# Patient Record
Sex: Female | Born: 1960 | Race: White | Hispanic: No | Marital: Married | Smoking: Never smoker
Health system: Southern US, Community
[De-identification: ages and names within clinical notes are randomized; demographics above are authoritative.]

## PROBLEM LIST (undated history)

## (undated) DIAGNOSIS — G43909 Migraine, unspecified, not intractable, without status migrainosus: Secondary | ICD-10-CM

## (undated) DIAGNOSIS — Z636 Dependent relative needing care at home: Secondary | ICD-10-CM

## (undated) DIAGNOSIS — E559 Vitamin D deficiency, unspecified: Secondary | ICD-10-CM

## (undated) DIAGNOSIS — E785 Hyperlipidemia, unspecified: Secondary | ICD-10-CM

## (undated) DIAGNOSIS — Z833 Family history of diabetes mellitus: Secondary | ICD-10-CM

## (undated) DIAGNOSIS — E669 Obesity, unspecified: Secondary | ICD-10-CM

## (undated) DIAGNOSIS — I10 Essential (primary) hypertension: Secondary | ICD-10-CM

## (undated) DIAGNOSIS — K219 Gastro-esophageal reflux disease without esophagitis: Secondary | ICD-10-CM

## (undated) HISTORY — DX: Gastro-esophageal reflux disease without esophagitis: K21.9

## (undated) HISTORY — DX: Vitamin D deficiency, unspecified: E55.9

## (undated) HISTORY — DX: Migraine, unspecified, not intractable, without status migrainosus: G43.909

## (undated) HISTORY — DX: Dependent relative needing care at home: Z63.6

## (undated) HISTORY — DX: Family history of diabetes mellitus: Z83.3

## (undated) HISTORY — DX: Obesity, unspecified: E66.9

## (undated) HISTORY — DX: Essential (primary) hypertension: I10

## (undated) HISTORY — DX: Hyperlipidemia, unspecified: E78.5

---

## 2018-02-03 ENCOUNTER — Ambulatory Visit: Payer: Self-pay | Admitting: Cardiology

## 2019-05-06 DIAGNOSIS — Z833 Family history of diabetes mellitus: Secondary | ICD-10-CM | POA: Insufficient documentation

## 2019-05-06 DIAGNOSIS — K219 Gastro-esophageal reflux disease without esophagitis: Secondary | ICD-10-CM | POA: Insufficient documentation

## 2019-05-06 DIAGNOSIS — G43909 Migraine, unspecified, not intractable, without status migrainosus: Secondary | ICD-10-CM | POA: Insufficient documentation

## 2019-05-06 DIAGNOSIS — I10 Essential (primary) hypertension: Secondary | ICD-10-CM | POA: Insufficient documentation

## 2019-05-06 DIAGNOSIS — E6609 Other obesity due to excess calories: Secondary | ICD-10-CM | POA: Insufficient documentation

## 2019-05-06 DIAGNOSIS — E559 Vitamin D deficiency, unspecified: Secondary | ICD-10-CM | POA: Insufficient documentation

## 2019-05-06 DIAGNOSIS — Z636 Dependent relative needing care at home: Secondary | ICD-10-CM | POA: Insufficient documentation

## 2022-04-27 DIAGNOSIS — E669 Obesity, unspecified: Secondary | ICD-10-CM | POA: Insufficient documentation

## 2022-04-27 DIAGNOSIS — E785 Hyperlipidemia, unspecified: Secondary | ICD-10-CM | POA: Insufficient documentation

## 2022-04-30 NOTE — Progress Notes (Unsigned)
Cardiology Office Note:    Date:  05/09/2022   ID:  Tammy Barrera, DOB 06-10-1961, MRN [id number]  PCP:  [facility]   [facility] Providers Cardiologist:  None {  Referring MD: Meriam Sprague, MD    History of Present Illness:    Tammy Barrera is a 61 y.o. female with a hx of HTN and HLD who was referred for further management of CV risk factors.   Past Medical History:  Diagnosis Date   Caregiver stress    Family history of diabetes mellitus (DM)    GERD (gastroesophageal reflux disease)    Hyperlipidemia    Hypertension    Migraines    Obesity    Vitamin D deficiency     History reviewed. No pertinent surgical history.  Current Medications: Current Meds  Medication Sig   MAGNESIUM-ZINC PO Take 1 tablet by mouth daily.   Multiple Vitamin (MULTIVITAMIN PO) Take 1 tablet by mouth daily.   Thiamine HCl (THIAMINE PO) Take 1 tablet by mouth daily.   VITAMIN D PO Take 1 tablet by mouth daily.     Allergies:   Patient has no known allergies.   Social History   Socioeconomic History   Marital status: Married    Spouse name: Not on file   Number of children: Not on file   Years of education: Not on file   Highest education level: Not on file  Occupational History   Not on file  Tobacco Use   Smoking status: Never   Smokeless tobacco: Never  Substance and Sexual Activity   Alcohol use: Not on file   Drug use: Not on file   Sexual activity: Not on file  Other Topics Concern   Not on file  Social History Narrative   Not on file   Social Determinants of Health   Financial Resource Strain: Not on file  Food Insecurity: Not on file  Transportation Needs: Not on file  Physical Activity: Not on file  Stress: Not on file  Social Connections: Not on file     Family History: The patient's ***family history includes Other in her father.  ROS:   Please see the history of present illness.    *** All other  systems reviewed and are negative.  EKGs/Labs/Other Studies Reviewed:    The following studies were reviewed today: ***  EKG:  EKG is *** ordered today.  The ekg ordered today demonstrates ***  Recent Labs: No results found for requested labs within last 8760 hours.  Recent Lipid Panel No results found for: CHOL, TRIG, HDL, CHOLHDL, VLDL, LDLCALC, LDLDIRECT   Risk Assessment/Calculations:   {Does this patient have ATRIAL FIBRILLATION?:[phone]}       Physical Exam:    VS:  BP 140/90   Pulse (!) 107   Ht 5\' 4"  (1.626 m)   Wt 188 lb 9.6 oz (85.5 kg)   SpO2 96%   BMI 32.37 kg/m     Wt Readings from Last 3 Encounters:  05/09/22 188 lb 9.6 oz (85.5 kg)     GEN: *** Well nourished, well developed in no acute distress HEENT: Normal NECK: No JVD; No carotid bruits LYMPHATICS: No lymphadenopathy CARDIAC: ***RRR, no murmurs, rubs, gallops RESPIRATORY:  Clear to auscultation without rales, wheezing or rhonchi  ABDOMEN: Soft, non-tender, non-distended MUSCULOSKELETAL:  No edema; No deformity  SKIN: Warm and dry NEUROLOGIC:  Alert and oriented x 3 PSYCHIATRIC:  Normal affect   ASSESSMENT:    No  diagnosis found. PLAN:    In order of problems listed above:  #HTN: Previously on telmesartan-HCTZ but not currently on meds  #HLD: LDL 146, HDL 63, TG 178. -Check Ca score      {Are you ordering a CV Procedure (e.g. stress test, cath, DCCV, TEE, etc)?   Press F2        :[id number]}    Medication Adjustments/Labs and Tests Ordered: Current medicines are reviewed at length with the patient today.  Concerns regarding medicines are outlined above.  No orders of the defined types were placed in this encounter.  No orders of the defined types were placed in this encounter.   There are no Patient Instructions on file for this visit.   Signed, Meriam Sprague, MD  05/09/2022 4:34 PM    [facility]

## 2022-05-09 ENCOUNTER — Ambulatory Visit: Payer: 59 | Admitting: Cardiology

## 2022-05-09 ENCOUNTER — Encounter: Payer: Self-pay | Admitting: Cardiology

## 2022-05-09 VITALS — BP 140/90 | HR 107 | Ht 64.0 in | Wt 188.6 lb

## 2022-05-09 DIAGNOSIS — I10 Essential (primary) hypertension: Secondary | ICD-10-CM

## 2022-05-09 DIAGNOSIS — Z8249 Family history of ischemic heart disease and other diseases of the circulatory system: Secondary | ICD-10-CM

## 2022-05-09 DIAGNOSIS — Z79899 Other long term (current) drug therapy: Secondary | ICD-10-CM | POA: Diagnosis not present

## 2022-05-09 DIAGNOSIS — E785 Hyperlipidemia, unspecified: Secondary | ICD-10-CM | POA: Diagnosis not present

## 2022-05-09 MED ORDER — VALSARTAN 80 MG PO TABS: 80.0000 mg | ORAL_TABLET | Freq: Every day | ORAL | 2 refills | Status: DC

## 2022-05-09 NOTE — Patient Instructions (Signed)
Medication Instructions:   START TAKING VALSARTAN 80 MG BY MOUTH DAILY  *If you need a refill on your cardiac medications before your next appointment, please call your pharmacy*   Lab Work:  IN 2 WEEKS HERE IN THE OFFICE--BMET  If you have labs (blood work) drawn today and your tests are completely normal, you will receive your results only by: MyChart Message (if you have MyChart) OR A paper copy in the mail If you have any lab test that is abnormal or we need to change your treatment, we will call you to review the results.   Testing/Procedures:  CARDIAC CALCIUM SCORE (SELF PAY) DONE HERE IN THE OFFICE   Follow-Up: At [facility], you and your health needs are our priority.  As part of our continuing mission to provide you with exceptional heart care, we have created designated Provider Care Teams.  These Care Teams include your primary Cardiologist (physician) and Advanced Practice Providers (APPs -  Physician Assistants and Nurse Practitioners) who all work together to provide you with the care you need, when you need it.  We recommend signing up for the patient portal called "MyChart".  Sign up information is provided on this After Visit Summary.  MyChart is used to connect with patients for Virtual Visits (Telemedicine).  Patients are able to view lab/test results, encounter notes, upcoming appointments, etc.  Non-urgent messages can be sent to your provider as well.   To learn more about what you can do with MyChart, go to [email or web address].    Your next appointment:   6 month(s)  The format for your next appointment:   In Person  Provider:   DR. Shari Prows   Important Information About Sugar

## 2022-05-09 NOTE — Progress Notes (Unsigned)
Cardiology Office Note:    Date:  05/09/2022   ID:  Tammy Barrera, DOB 04-14-1961, MRN [id number]  PCP:  [facility]   [facility] Providers Cardiologist:  None {  Referring MD: Freada Bergeron, MD    History of Present Illness:    Tammy Barrera is a 61 y.o. female with a hx of HTN and HLD who was referred for further management of CV risk factors.   She was most recently seen by Dr. Doreene Nest on 04/27/2022. 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 At that visit she denied current cardiovascular symptoms.  Today, the patient states that she is here to establish care for risk stratification. Her regimen has previously included Telmisartan-HCTZ, Zetia, and Topamax (for migraines). She denies being on any statin medication for cholesterol.  Sometimes she feels racing heart beats, but she is not overly concerned about this.  Occasionally she notices some LE edema. She denies any orthopnea or PND.  For activity she completes lots of yard work and walking. She does not do much intense, formal exercise. Also denies any physical limitations from a cardiovascular perspective. No known episodes of dehydration.  She states that controlling her weight has always been a struggle. Lately she notices her weight increases despite trying to follow a more healthy diet.  She denies any chest pain, or shortness of breath. No lightheadedness, headaches, syncope, orthopnea, or PND.  On 04/27/22 she had lab work completed at [facility].   Past Medical History:  Diagnosis Date   Caregiver stress    Family history of diabetes mellitus (DM)    GERD (gastroesophageal reflux disease)    Hyperlipidemia    Hypertension    Migraines    Obesity    Vitamin D deficiency     History reviewed. No pertinent surgical history.  Current Medications: Current Meds  Medication Sig   MAGNESIUM-ZINC PO Take 1 tablet by mouth  daily.   Multiple Vitamin (MULTIVITAMIN PO) Take 1 tablet by mouth daily.   Thiamine HCl (THIAMINE PO) Take 1 tablet by mouth daily.   valsartan (DIOVAN) 80 MG tablet Take 1 tablet (80 mg total) by mouth daily.   VITAMIN D PO Take 1 tablet by mouth daily.     Allergies:   Patient has no known allergies.   Social History   Socioeconomic History   Marital status: Married    Spouse name: Not on file   Number of children: Not on file   Years of education: Not on file   Highest education level: Not on file  Occupational History   Not on file  Tobacco Use   Smoking status: Never   Smokeless tobacco: Never  Substance and Sexual Activity   Alcohol use: Not on file   Drug use: Not on file   Sexual activity: Not on file  Other Topics Concern   Not on file  Social History Narrative   Not on file   Social Determinants of Health   Financial Resource Strain: Not on file  Food Insecurity: Not on file  Transportation Needs: Not on file  Physical Activity: Not on file  Stress: Not on file  Social Connections: Not on file     Family History: The patient's family history includes Other in her father.  ROS:   Review of Systems  Constitutional:  Negative for chills and fever.  HENT:  Negative for ear pain.   Eyes:  Negative  for photophobia.  Respiratory:  Negative for hemoptysis, sputum production and stridor.   Cardiovascular:  Positive for palpitations and leg swelling. Negative for chest pain, orthopnea, claudication and PND.  Gastrointestinal:  Negative for melena and vomiting.  Genitourinary:  Negative for flank pain.  Musculoskeletal:  Negative for falls.  Neurological:  Negative for sensory change and seizures.  Endo/Heme/Allergies:  Negative for polydipsia.  Psychiatric/Behavioral:  Negative for memory loss and substance abuse.     EKGs/Labs/Other Studies Reviewed:    The following studies were reviewed today:  No prior cardiovascular studies available.   EKG:   EKG is personally reviewed. 05/09/2022: Sinus tachycardia. Rate 107 bpm.   Recent Labs: No results found for requested labs within last 8760 hours.   Recent Lipid Panel No results found for: CHOL, TRIG, HDL, CHOLHDL, VLDL, LDLCALC, LDLDIRECT   Risk Assessment/Calculations:           Physical Exam:    VS:  BP 140/90   Pulse (!) 107   Ht 5\' 4"  (1.626 m)   Wt 188 lb 9.6 oz (85.5 kg)   SpO2 96%   BMI 32.37 kg/m     Wt Readings from Last 3 Encounters:  05/09/22 188 lb 9.6 oz (85.5 kg)     GEN: Well nourished, well developed in no acute distress HEENT: Normal NECK: No JVD; No carotid bruits CARDIAC: RRR, 1/6 soft systolic murmurs, No rubs, no gallops RESPIRATORY:  Clear to auscultation without rales, wheezing or rhonchi  ABDOMEN: Soft, non-tender, non-distended MUSCULOSKELETAL:  No edema; No deformity  SKIN: Warm and dry NEUROLOGIC:  Alert and oriented x 3 PSYCHIATRIC:  Normal affect   ASSESSMENT:    1. Family history of early CAD   2. Medication management   3. Hyperlipidemia, unspecified hyperlipidemia type   4. Hypertension, unspecified type    PLAN:    In order of problems listed above:  #HTN: Previously on telmesartan-HCTZ but not currently on meds. BP 140 today. Will restart ARB and monitor response. -Continue valsartan 80mg  daily -Check BMET next week -Keep BP log and can uptitrate as needed if BP not at goal <120/80s  #HLD: #Family history of CAD LDL 146, HDL 63, TG 178. -Check Ca score for risk stratification -Patient amenable to start statin if Ca score elevated         Follow-up:  6 months.  Medication Adjustments/Labs and Tests Ordered: Current medicines are reviewed at length with the patient today.  Concerns regarding medicines are outlined above.   Orders Placed This Encounter  Procedures   CT CARDIAC SCORING (SELF PAY ONLY)   Basic metabolic panel   EKG [id number]   Meds ordered this encounter  Medications   valsartan (DIOVAN) 80  MG tablet    Sig: Take 1 tablet (80 mg total) by mouth daily.    Dispense:  90 tablet    Refill:  2   Patient Instructions  Medication Instructions:   START TAKING VALSARTAN 80 MG BY MOUTH DAILY  *If you need a refill on your cardiac medications before your next appointment, please call your pharmacy*   Lab Work:  IN 2 [place] OFFICE--BMET  If you have labs (blood work) drawn today and your tests are completely normal, you will receive your results only by: [place] (if you have MyChart) OR A paper copy in the mail If you have any lab test that is abnormal or we need to change your treatment, we will call you to review the  results.   Testing/Procedures:  CARDIAC CALCIUM SCORE (SELF PAY) DONE HERE IN THE OFFICE   Follow-Up: At [facility], you and your health needs are our priority.  As part of our continuing mission to provide you with exceptional heart care, we have created designated Provider Care Teams.  These Care Teams include your primary Cardiologist (physician) and Advanced Practice Providers (APPs -  Physician Assistants and Nurse Practitioners) who all work together to provide you with the care you need, when you need it.  We recommend signing up for the patient portal called "MyChart".  Sign up information is provided on this After Visit Summary.  MyChart is used to connect with patients for Virtual Visits (Telemedicine).  Patients are able to view lab/test results, encounter notes, upcoming appointments, etc.  Non-urgent messages can be sent to your provider as well.   To learn more about what you can do with MyChart, go to [email or web address].    Your next appointment:   6 month(s)  The format for your next appointment:   In Person  Provider:   DR. Johney Frame   Important Information About Sugar         I,Mathew Stumpf,acting as a scribe for Freada Bergeron, MD.,have documented all relevant documentation on the behalf of  Freada Bergeron, MD,as directed by  Freada Bergeron, MD while in the presence of Freada Bergeron, MD.  I, Freada Bergeron, MD, have reviewed all documentation for this visit. The documentation on 05/09/22 for the exam, diagnosis, procedures, and orders are all accurate and complete.   Signed, Freada Bergeron, MD  05/09/2022 5:19 PM    [place]

## 2022-05-23 ENCOUNTER — Other Ambulatory Visit: Payer: 59

## 2022-05-23 ENCOUNTER — Ambulatory Visit
Admission: RE | Admit: 2022-05-23 | Discharge: 2022-05-23 | Disposition: A | Discharge status: Home / Self Care | Payer: 59 | Source: Ambulatory Visit | Attending: Cardiology | Admitting: Cardiology

## 2022-05-23 DIAGNOSIS — E785 Hyperlipidemia, unspecified: Secondary | ICD-10-CM

## 2022-05-23 DIAGNOSIS — Z8249 Family history of ischemic heart disease and other diseases of the circulatory system: Secondary | ICD-10-CM

## 2022-05-23 DIAGNOSIS — I10 Essential (primary) hypertension: Secondary | ICD-10-CM

## 2022-05-23 DIAGNOSIS — Z79899 Other long term (current) drug therapy: Secondary | ICD-10-CM

## 2022-05-24 LAB — BASIC METABOLIC PANEL
BUN/Creatinine Ratio: 12 (ref 12–28)
BUN: 10 mg/dL (ref 8–27)
CO2: 24 mmol/L (ref 20–29)
Calcium: 9.1 mg/dL (ref 8.7–10.3)
Chloride: 103 mmol/L (ref 96–106)
Creatinine, Ser: 0.81 mg/dL (ref 0.57–1.00)
Glucose: 95 mg/dL (ref 70–99)
Potassium: 3.8 mmol/L (ref 3.5–5.2)
Sodium: 142 mmol/L (ref 134–144)
eGFR: 83 mL/min/{1.73_m2} (ref >59)

## 2022-05-25 ENCOUNTER — Telehealth: Payer: Self-pay | Admitting: *Deleted

## 2022-05-25 DIAGNOSIS — E785 Hyperlipidemia, unspecified: Secondary | ICD-10-CM

## 2022-05-25 DIAGNOSIS — Z8249 Family history of ischemic heart disease and other diseases of the circulatory system: Secondary | ICD-10-CM

## 2022-05-25 DIAGNOSIS — I2510 Atherosclerotic heart disease of native coronary artery without angina pectoris: Secondary | ICD-10-CM

## 2022-05-25 DIAGNOSIS — Z79899 Other long term (current) drug therapy: Secondary | ICD-10-CM

## 2022-05-25 MED ORDER — ROSUVASTATIN CALCIUM 10 MG PO TABS: 10.0000 mg | ORAL_TABLET | Freq: Every day | ORAL | 2 refills | Status: DC

## 2022-05-25 NOTE — Telephone Encounter (Signed)
The patient has been notified of the result and verbalized understanding.  All questions (if any) were answered.  Pt agreed to start taking crestor 10 mg po daily and have repeat lipids in 6-8 weeks.  Confirmed the pharmacy of choice with the pt. Scheduled the pt for repeat lipids on 07/20/22.  She is aware to come fasting to this lab appt.  Pt verbalized understanding and agrees with this plan.

## 2022-05-25 NOTE — Telephone Encounter (Signed)
-----   Message from Sampson Goon, RN sent at 05/24/2022 10:34 AM EDT -----  ----- Message ----- From: Meriam Sprague, MD Sent: 05/23/2022   7:08 PM EDT To: [facility] Triage  Ca score is 24. This is the 75% for age, race, gender matched controls. Given that her Ca score is elevated which indicates she has some plaque in her heart arteries, we would like to start a statin to prevent the plaque from worsening. If she is amenable, would start crestor 10mg  daily and repeat lipids in 6-8 weeks. Our goal LDL for her is <70.

## 2022-07-20 ENCOUNTER — Other Ambulatory Visit: Payer: 59

## 2022-07-20 DIAGNOSIS — Z79899 Other long term (current) drug therapy: Secondary | ICD-10-CM

## 2022-07-20 DIAGNOSIS — I2510 Atherosclerotic heart disease of native coronary artery without angina pectoris: Secondary | ICD-10-CM

## 2022-07-20 DIAGNOSIS — E785 Hyperlipidemia, unspecified: Secondary | ICD-10-CM

## 2022-07-20 DIAGNOSIS — Z8249 Family history of ischemic heart disease and other diseases of the circulatory system: Secondary | ICD-10-CM

## 2022-07-20 LAB — LIPID PANEL
Chol/HDL Ratio: 2.3 ratio (ref 0.0–4.4)
Cholesterol, Total: 138 mg/dL (ref 100–199)
HDL: 61 mg/dL (ref >39)
LDL Chol Calc (NIH): 63 mg/dL (ref 0–99)
Triglycerides: 71 mg/dL (ref 0–149)
VLDL Cholesterol Cal: 14 mg/dL (ref 5–40)

## 2022-11-11 NOTE — Progress Notes (Unsigned)
Cardiology Office Note:    Date:  11/11/2022   ID:  Tammy Barrera, DOB 07/16/1961, MRN [id number]  [facility]P:  [facility]   [facility] Providers Cardiologist:  None {  Referring MD: [facility]*    History of Present Illness:    Tammy Barrera is a 61 y.o. female with a hx of HTN and HLD who presents to clinic for follow-up.  Was initially referred to clinic in 04/2022 for CV risk stratification. Ca score 24 (75%). She was started on crestor at that time.  Today, ***   Past Medical History:  Diagnosis Date   Caregiver stress    Family history of diabetes mellitus (DM)    GERD (gastroesophageal reflux disease)    Hyperlipidemia    Hypertension    Migraines    Obesity    Vitamin D deficiency     No past surgical history on file.  Current Medications: No outpatient medications have been marked as taking for the 11/13/22 encounter (Appointment) with Meriam Sprague, MD.     Allergies:   Patient has no known allergies.   Social History   Socioeconomic History   Marital status: Married    Spouse name: Not on file   Number of children: Not on file   Years of education: Not on file   Highest education level: Not on file  Occupational History   Not on file  Tobacco Use   Smoking status: Never   Smokeless tobacco: Never  Substance and Sexual Activity   Alcohol use: Not on file   Drug use: Not on file   Sexual activity: Not on file  Other Topics Concern   Not on file  Social History Narrative   Not on file   Social Determinants of Health   Financial Resource Strain: Not on file  Food Insecurity: Not on file  Transportation Needs: Not on file  Physical Activity: Not on file  Stress: Not on file  Social Connections: Not on file     Family History: The patient's family history includes Other in her father.  ROS:   Review of Systems  Constitutional:  Negative for chills and fever.  HENT:  Negative  for ear pain.   Eyes:  Negative for photophobia.  Respiratory:  Negative for hemoptysis, sputum production and stridor.   Cardiovascular:  Positive for palpitations and leg swelling. Negative for chest pain, orthopnea, claudication and PND.  Gastrointestinal:  Negative for melena and vomiting.  Genitourinary:  Negative for flank pain.  Musculoskeletal:  Negative for falls.  Neurological:  Negative for sensory change and seizures.  Endo/Heme/Allergies:  Negative for polydipsia.  Psychiatric/Behavioral:  Negative for memory loss and substance abuse.      EKGs/Labs/Other Studies Reviewed:    The following studies were reviewed today: Ca score 05/2022: FINDINGS: Coronary arteries: Normal origins.   Coronary Calcium Score:   Left main: 0   Left anterior descending artery: 0   Left circumflex artery: 21   Right coronary artery: 3   Total: 24   Percentile: 75th   Pericardium: Normal.   Aorta: Normal caliber of ascending aorta. Aortic atherosclerosis noted.   Non-cardiac: See separate report from [facility].   IMPRESSION: Coronary calcium score of 24. This was 75th percentile for age-, race-, and sex-matched controls. Aortic atherosclerosis.   EKG:  EKG is personally reviewed. 05/09/2022: Sinus tachycardia. Rate 107 bpm.   Recent Labs: 05/23/2022: BUN 10; Creatinine, Ser 0.81; Potassium 3.8; Sodium 142  Recent Lipid Panel    Component Value Date/Time   CHOL 138 07/20/2022 1056   TRIG 71 07/20/2022 1056   HDL 61 07/20/2022 1056   CHOLHDL 2.3 07/20/2022 1056   LDLCALC 63 07/20/2022 1056     Risk Assessment/Calculations:           Physical Exam:    VS:  There were no vitals taken for this visit.    Wt Readings from Last 3 Encounters:  05/09/22 188 lb 9.6 oz (85.5 kg)     GEN: Well nourished, well developed in no acute distress HEENT: Normal NECK: No JVD; No carotid bruits CARDIAC: RRR, 1/6 soft systolic murmurs, No rubs, no  gallops RESPIRATORY:  Clear to auscultation without rales, wheezing or rhonchi  ABDOMEN: Soft, non-tender, non-distended MUSCULOSKELETAL:  No edema; No deformity  SKIN: Warm and dry NEUROLOGIC:  Alert and oriented x 3 PSYCHIATRIC:  Normal affect   ASSESSMENT:    No diagnosis found.  PLAN:    In order of problems listed above:  #HTN: *** -Continue valsartan 80mg  daily  #HLD: -Continue crestor 10mg  daily  #CAD: Ca score 24 (75%). No anginal symptoms.  -Continue crestor 10mg  daily         Follow-up:  6 months.  Medication Adjustments/Labs and Tests Ordered: Current medicines are reviewed at length with the patient today.  Concerns regarding medicines are outlined above.   No orders of the defined types were placed in this encounter.  No orders of the defined types were placed in this encounter.  There are no Patient Instructions on file for this visit.    Signed, , MD  11/11/2022 7:50 PM    [facility]

## 2022-11-13 ENCOUNTER — Telehealth: Payer: Self-pay | Admitting: *Deleted

## 2022-11-13 ENCOUNTER — Encounter: Payer: Self-pay | Admitting: Cardiology

## 2022-11-13 ENCOUNTER — Ambulatory Visit: Payer: 59 | Attending: Cardiology | Admitting: Cardiology

## 2022-11-13 ENCOUNTER — Ambulatory Visit (INDEPENDENT_AMBULATORY_CARE_PROVIDER_SITE_OTHER): Payer: 59

## 2022-11-13 VITALS — BP 136/100 | HR 91 | Ht 64.0 in | Wt 185.8 lb

## 2022-11-13 DIAGNOSIS — E785 Hyperlipidemia, unspecified: Secondary | ICD-10-CM | POA: Diagnosis not present

## 2022-11-13 DIAGNOSIS — I10 Essential (primary) hypertension: Secondary | ICD-10-CM

## 2022-11-13 DIAGNOSIS — R002 Palpitations: Secondary | ICD-10-CM

## 2022-11-13 DIAGNOSIS — I2510 Atherosclerotic heart disease of native coronary artery without angina pectoris: Secondary | ICD-10-CM | POA: Diagnosis not present

## 2022-11-13 DIAGNOSIS — Z79899 Other long term (current) drug therapy: Secondary | ICD-10-CM

## 2022-11-13 DIAGNOSIS — Z8249 Family history of ischemic heart disease and other diseases of the circulatory system: Secondary | ICD-10-CM

## 2022-11-13 MED ORDER — VALSARTAN 160 MG PO TABS: 160.0000 mg | ORAL_TABLET | Freq: Every day | ORAL | 2 refills | Status: DC

## 2022-11-13 NOTE — Progress Notes (Signed)
Cardiology Office Note:    Date:  11/13/2022   ID:  Tammy Barrera, DOB May 20, 1961, MRN [id number]  PCP:  [facility]   [facility] Providers Cardiologist:  None {  Referring MD: [facility]*    History of Present Illness:    Tammy Barrera is a 61 y.o. female with a hx of HTN and HLD who presents to clinic for follow-up.  Was initially referred to clinic in 04/2022 for CV risk stratification. Ca score 24 (75%). She was started on crestor at that time.  Today, the patient states that she had 2 major episodes of racing palpitations that woke her from sleep. These episodes occurred the last week of 09/2022, and on 11/03/22. During those episodes her palpitations continued until the next day, when she would develop her usual migraine symptoms.  In the past week, she is also experiencing daily episodes of palpitations usually with a duration of 30 min to 1 hour. She believes her stress and anxiety are contributing. Temporarily she may have a cough that interrupts her palpitations, but they resume afterwards. No associated chest pain, SOB, or syncope.  Lately her blood pressure has been higher at home, 140-160 systolic.   She denies any chest pain, shortness of breath, or peripheral edema. No lightheadedness, syncope, orthopnea, or PND.   Past Medical History:  Diagnosis Date   Caregiver stress    Family history of diabetes mellitus (DM)    GERD (gastroesophageal reflux disease)    Hyperlipidemia    Hypertension    Migraines    Obesity    Vitamin D deficiency     History reviewed. No pertinent surgical history.  Current Medications: Current Meds  Medication Sig   MAGNESIUM-ZINC PO Take 1 tablet by mouth daily.   Multiple Vitamin (MULTIVITAMIN PO) Take 1 tablet by mouth daily.   rosuvastatin (CRESTOR) 10 MG tablet Take 1 tablet (10 mg total) by mouth daily.   Thiamine HCl (THIAMINE PO) Take 1 tablet by mouth daily.    valsartan (DIOVAN) 160 MG tablet Take 1 tablet (160 mg total) by mouth daily.   VITAMIN D PO Take 1 tablet by mouth daily.   [DISCONTINUED] valsartan (DIOVAN) 80 MG tablet Take 1 tablet (80 mg total) by mouth daily.     Allergies:   Patient has no known allergies.   Social History   Socioeconomic History   Marital status: Married    Spouse name: Not on file   Number of children: Not on file   Years of education: Not on file   Highest education level: Not on file  Occupational History   Not on file  Tobacco Use   Smoking status: Never   Smokeless tobacco: Never  Substance and Sexual Activity   Alcohol use: Not on file   Drug use: Not on file   Sexual activity: Not on file  Other Topics Concern   Not on file  Social History Narrative   Not on file   Social Determinants of Health   Financial Resource Strain: Not on file  Food Insecurity: Not on file  Transportation Needs: Not on file  Physical Activity: Not on file  Stress: Not on file  Social Connections: Not on file     Family History: The patient's family history includes Other in her father.  ROS:   Review of Systems  Constitutional:  Negative for chills and fever.  HENT:  Negative for ear pain.   Eyes:  Negative for photophobia.  Respiratory:  Negative for hemoptysis, sputum production and stridor.   Cardiovascular:  Positive for palpitations. Negative for chest pain, orthopnea, claudication, leg swelling and PND.  Gastrointestinal:  Negative for melena and vomiting.  Genitourinary:  Negative for flank pain.  Musculoskeletal:  Negative for falls.  Neurological:  Negative for sensory change and seizures.  Endo/Heme/Allergies:  Negative for polydipsia.  Psychiatric/Behavioral:  Negative for memory loss and substance abuse. The patient is nervous/anxious.      EKGs/Labs/Other Studies Reviewed:    The following studies were reviewed today:  Ca score 05/2022: FINDINGS: Coronary arteries: Normal origins.    Coronary Calcium Score:   Left main: 0   Left anterior descending artery: 0   Left circumflex artery: 21   Right coronary artery: 3   Total: 24   Percentile: 75th   Pericardium: Normal.   Aorta: Normal caliber of ascending aorta. Aortic atherosclerosis noted.   Non-cardiac: See separate report from [facility].   IMPRESSION: Coronary calcium score of 24. This was 75th percentile for age-, race-, and sex-matched controls. Aortic atherosclerosis.   EKG:  EKG is personally reviewed. 11/13/2022:  EKG was not ordered. 05/09/2022: Sinus tachycardia. Rate 107 bpm.   Recent Labs: 05/23/2022: BUN 10; Creatinine, Ser 0.81; Potassium 3.8; Sodium 142   Recent Lipid Panel    Component Value Date/Time   CHOL 138 07/20/2022 1056   TRIG 71 07/20/2022 1056   HDL 61 07/20/2022 1056   CHOLHDL 2.3 07/20/2022 1056   LDLCALC 63 07/20/2022 1056     Risk Assessment/Calculations:           Physical Exam:    VS:  BP (!) 136/100 Comment: Right arm 136/100  Pulse 91   Ht 5\' 4"  (1.626 m)   Wt 185 lb 12.8 oz (84.3 kg)   SpO2 98%   BMI 31.89 kg/m     Wt Readings from Last 3 Encounters:  11/13/22 185 lb 12.8 oz (84.3 kg)  05/09/22 188 lb 9.6 oz (85.5 kg)     GEN: Well nourished, well developed in no acute distress HEENT: Normal NECK: No JVD; No carotid bruits CARDIAC: RRR, 1/6 systolic murmur RESPIRATORY:  Clear to auscultation without rales, wheezing or rhonchi  ABDOMEN: Soft, non-tender, non-distended MUSCULOSKELETAL:  No edema; No deformity  SKIN: Warm and dry NEUROLOGIC:  Alert and oriented x 3 PSYCHIATRIC:  Normal affect   ASSESSMENT:    1. Medication management   2. Family history of early CAD   3. Hyperlipidemia, unspecified hyperlipidemia type   4. Coronary artery calcification seen on CT scan   5. Hypertension, unspecified type   6. Palpitations     PLAN:    In order of problems listed above:  #Palpitations: Patient reports intermittent  palpitations that have increased in frequency with added stress at work. No associated chest pain, SOB or lightheadedness. Will check zio monitor for further evaluation. -Check 7 day zio -Increase hydration -Continue PO mag -Cut back on caffeine  #HTN: Elevated at 140 at home. Will increase valsartan. -Increase valsartan to 160mg  daily -BMET next week  #HLD: -Continue crestor 10mg  daily  #CAD: Ca score 24 (75%). No anginal symptoms.  -Continue crestor 10mg  daily       Follow-up:  1 year.  Medication Adjustments/Labs and Tests Ordered: Current medicines are reviewed at length with the patient today.  Concerns regarding medicines are outlined above.   Orders Placed This Encounter  Procedures   Basic metabolic panel   LONG TERM MONITOR (3-14 DAYS)  Meds ordered this encounter  Medications   valsartan (DIOVAN) 160 MG tablet    Sig: Take 1 tablet (160 mg total) by mouth daily.    Dispense:  90 tablet    Refill:  2    DOSE INCREASE   Patient Instructions  Medication Instructions:   INCREASE YOUR VALSARTAN TO 160 MG BY MOUTH DAILY  *If you need a refill on your cardiac medications before your next appointment, please call your pharmacy*   Lab Work:  IN ONE WEEK HERE IN THE OFFICE--BMET  If you have labs (blood work) drawn today and your tests are completely normal, you will receive your results only by: MyChart Message (if you have MyChart) OR A paper copy in the mail If you have any lab test that is abnormal or we need to change your treatment, we will call you to review the results.   Testing/Procedures:  Christena DeemZIO XT- Long Term Monitor Instructions  Your physician has requested you wear a ZIO patch monitor for 7 days.  This is a single patch monitor. [employer] supplies one patch monitor per enrollment. Additional stickers are not available. Please do not apply patch if you will be having a Nuclear Stress Test,  Echocardiogram, Cardiac CT, MRI, or Chest Xray during  the period you would be wearing the  monitor. The patch cannot be worn during these tests. You cannot remove and re-apply the  ZIO XT patch monitor.  Your ZIO patch monitor will be mailed 3 day USPS to your address on file. It may take 3-5 days  to receive your monitor after you have been enrolled.  Once you have received your monitor, please review the enclosed instructions. Your monitor  has already been registered assigning a specific monitor serial # to you.  Billing and Patient Assistance Program Information  We have supplied [employer] with any of your insurance information on file for billing purposes. [employer] offers a sliding scale Patient Assistance Program for patients that do not have  insurance, or whose insurance does not completely cover the cost of the ZIO monitor.  You must apply for the Patient Assistance Program to qualify for this discounted rate.  To apply, please call [employer] at [phone], select option 4, select option 2, ask to apply for  Patient Assistance Program. Meredeth [employer] will ask your household income, and how many people  are in your household. They will quote your out-of-pocket cost based on that information.  [employer] will also be able to set up a 3260-month, interest-free payment plan if needed.  Applying the monitor   Shave hair from upper left chest.  Hold abrader disc by orange tab. Rub abrader in 40 strokes over the upper left chest as  indicated in your monitor instructions.  Clean area with 4 enclosed alcohol pads. Let dry.  Apply patch as indicated in monitor instructions. Patch will be placed under collarbone on left  side of chest with arrow pointing upward.  Rub patch adhesive wings for 2 minutes. Remove white label marked "1". Remove the white  label marked "2". Rub patch adhesive wings for 2 additional minutes.  While looking in a mirror, press and release button in center of patch. A small green light will  flash 3-4 times. This will be your only  indicator that the monitor has been turned on.  Do not shower for the first 24 hours. You may shower after the first 24 hours.  Press the button if you feel a symptom. You will hear a small  click. Record Date, Time and  Symptom in the Patient Logbook.  When you are ready to remove the patch, follow instructions on the last 2 pages of Patient  Logbook. Stick patch monitor onto the last page of Patient Logbook.  Place Patient Logbook in the blue and white box. Use locking tab on box and tape box closed  securely. The blue and white box has prepaid postage on it. Please place it in the mailbox as  soon as possible. Your physician should have your test results approximately 7 days after the  monitor has been mailed back to [facility]  Call [facility] Customer Care at [phone] if you have questions regarding  your ZIO XT patch monitor. Call them immediately if you see an orange light blinking on your  monitor.  If your monitor falls off in less than 4 days, contact our Monitor department at [phone].  If your monitor becomes loose or falls off after 4 days call [employer] at [phone] for  suggestions on securing your monitor    Follow-Up: At [facility], you and your health needs are our priority.  As part of our continuing mission to provide you with exceptional heart care, we have created designated Provider Care Teams.  These Care Teams include your primary Cardiologist (physician) and Advanced Practice Providers (APPs -  Physician Assistants and Nurse Practitioners) who all work together to provide you with the care you need, when you need it.  We recommend signing up for the patient portal called "MyChart".  Sign up information is provided on this After Visit Summary.  MyChart is used to connect with patients for Virtual Visits (Telemedicine).  Patients are able to view lab/test results, encounter notes, upcoming appointments, etc.  Non-urgent messages can be sent  to your provider as well.   To learn more about what you can do with MyChart, go to [email or web address].    Your next appointment:   1 year(s)  The format for your next appointment:   In Person  Provider:   DR. Shari Prows    Information About Sugar        I,Mathew Stumpf,acting as a scribe for Meriam Sprague, MD.,have documented all relevant documentation on the behalf of Meriam Sprague, MD,as directed by  Meriam Sprague, MD while in the presence of Meriam Sprague, MD.  I, Meriam Sprague, MD, have reviewed all documentation for this visit. The documentation on 11/13/22 for the exam, diagnosis, procedures, and orders are all accurate and complete.    Signed, Meriam Sprague, MD  11/13/2022 3:13 PM    [facility]

## 2022-11-13 NOTE — Telephone Encounter (Signed)
-----   Message from Flavia Shipper sent at 11/13/2022  3:34 PM EST ----- Regarding: RE: 7 DAY ZIO PER DR. Shari Prows Done ----- Message ----- From: Loa Socks, LPN Sent: 10/19/1593   3:10 PM EST To: Loa Socks, LPN; Shelly A Wells; # Subject: 7 DAY ZIO PER DR. PEMBERTON                    7 Day zio ordered for palpitations  Please enroll    Thanks Rodric Punch

## 2022-11-13 NOTE — Progress Notes (Unsigned)
Enrolled patient for a 7 day Zio XT monitor to be mailed to patients home.  

## 2022-11-13 NOTE — Patient Instructions (Signed)
Medication Instructions:   INCREASE YOUR VALSARTAN TO 160 MG BY MOUTH DAILY  *If you need a refill on your cardiac medications before your next appointment, please call your pharmacy*   Lab Work:  IN ONE WEEK HERE IN THE OFFICE--BMET  If you have labs (blood work) drawn today and your tests are completely normal, you will receive your results only by: MyChart Message (if you have MyChart) OR A paper copy in the mail If you have any lab test that is abnormal or we need to change your treatment, we will call you to review the results.   Testing/Procedures:  Tammy Barrera- Long Term Monitor Instructions  Your physician has requested you wear a ZIO patch monitor for 7 days.  This is a single patch monitor. [employer] supplies one patch monitor per enrollment. Additional stickers are not available. Please do not apply patch if you will be having a Nuclear Stress Test,  Echocardiogram, Cardiac CT, MRI, or Chest Xray during the period you would be wearing the  monitor. The patch cannot be worn during these tests. You cannot remove and re-apply the  ZIO XT patch monitor.  Your ZIO patch monitor will be mailed 3 day USPS to your address on file. It may take 3-5 days  to receive your monitor after you have been enrolled.  Once you have received your monitor, please review the enclosed instructions. Your monitor  has already been registered assigning a specific monitor serial # to you.  Billing and Patient Assistance Program Information  We have supplied [employer] with any of your insurance information on file for billing purposes. [employer] offers a sliding scale Patient Assistance Program for patients that do not have  insurance, or whose insurance does not completely cover the cost of the ZIO monitor.  You must apply for the Patient Assistance Program to qualify for this discounted rate.  To apply, please call [employer] at [phone], select option 4, select option 2, ask to apply for  Patient  Assistance Program. Tammy Barrera will ask your household income, and how many people  are in your household. They will quote your out-of-pocket cost based on that information.  [employer] will also be able to set up a 51-month, interest-free payment plan if needed.  Applying the monitor   Shave hair from upper left chest.  Hold abrader disc by orange tab. Rub abrader in 40 strokes over the upper left chest as  indicated in your monitor instructions.  Clean area with 4 enclosed alcohol pads. Let dry.  Apply patch as indicated in monitor instructions. Patch will be placed under collarbone on left  side of chest with arrow pointing upward.  Rub patch adhesive wings for 2 minutes. Remove white label marked "1". Remove the white  label marked "2". Rub patch adhesive wings for 2 additional minutes.  While looking in a mirror, press and release button in center of patch. A small green light will  flash 3-4 times. This will be your only indicator that the monitor has been turned on.  Do not shower for the first 24 hours. You may shower after the first 24 hours.  Press the button if you feel a symptom. You will hear a small click. Record Date, Time and  Symptom in the Patient Logbook.  When you are ready to remove the patch, follow instructions on the last 2 pages of Patient  Logbook. Stick patch monitor onto the last page of Patient Logbook.  Place Patient Logbook in the blue and white  box. Use locking tab on box and tape box closed  securely. The blue and white box has prepaid postage on it. Please place it in the mailbox as  soon as possible. Your physician should have your test results approximately 7 days after the  monitor has been mailed back to [facility].  Call [facility] at [phone] if you have questions regarding  your ZIO XT patch monitor. Call them immediately if you see an orange light blinking on your  monitor.  If your monitor falls off in less than 4 days,  contact our Monitor department at [phone].  If your monitor becomes loose or falls off after 4 days call [employer] at [phone] for  suggestions on securing your monitor    Follow-Up: At [facility], you and your health needs are our priority.  As part of our continuing mission to provide you with exceptional heart care, we have created designated Provider Care Teams.  These Care Teams include your primary Cardiologist (physician) and Advanced Practice Providers (APPs -  Physician Assistants and Nurse Practitioners) who all work together to provide you with the care you need, when you need it.  We recommend signing up for the patient portal called "MyChart".  Sign up information is provided on this After Visit Summary.  MyChart is used to connect with patients for Virtual Visits (Telemedicine).  Patients are able to view lab/test results, encounter notes, upcoming appointments, etc.  Non-urgent messages can be sent to your provider as well.   To learn more about what you can do with MyChart, go to [email or web address].    Your next appointment:   1 year(s)  The format for your next appointment:   In Person  Provider:   DR. Johney Frame    Information About Sugar

## 2022-11-16 DIAGNOSIS — R002 Palpitations: Secondary | ICD-10-CM

## 2022-11-22 ENCOUNTER — Ambulatory Visit: Payer: 59 | Attending: Cardiology

## 2022-11-22 DIAGNOSIS — I2510 Atherosclerotic heart disease of native coronary artery without angina pectoris: Secondary | ICD-10-CM

## 2022-11-22 DIAGNOSIS — I10 Essential (primary) hypertension: Secondary | ICD-10-CM

## 2022-11-22 DIAGNOSIS — R002 Palpitations: Secondary | ICD-10-CM

## 2022-11-22 DIAGNOSIS — E785 Hyperlipidemia, unspecified: Secondary | ICD-10-CM

## 2022-11-22 DIAGNOSIS — Z79899 Other long term (current) drug therapy: Secondary | ICD-10-CM

## 2022-11-22 DIAGNOSIS — Z8249 Family history of ischemic heart disease and other diseases of the circulatory system: Secondary | ICD-10-CM

## 2022-11-22 LAB — BASIC METABOLIC PANEL
BUN/Creatinine Ratio: 13 (ref 12–28)
BUN: 11 mg/dL (ref 8–27)
CO2: 26 mmol/L (ref 20–29)
Calcium: 9.5 mg/dL (ref 8.7–10.3)
Chloride: 102 mmol/L (ref 96–106)
Creatinine, Ser: 0.83 mg/dL (ref 0.57–1.00)
Glucose: 80 mg/dL (ref 70–99)
Potassium: 3.9 mmol/L (ref 3.5–5.2)
Sodium: 142 mmol/L (ref 134–144)
eGFR: 80 mL/min/{1.73_m2} (ref >59)

## 2022-11-28 ENCOUNTER — Telehealth: Payer: Self-pay | Admitting: *Deleted

## 2022-11-28 DIAGNOSIS — R9431 Abnormal electrocardiogram [ECG] [EKG]: Secondary | ICD-10-CM

## 2022-11-28 DIAGNOSIS — I10 Essential (primary) hypertension: Secondary | ICD-10-CM

## 2022-11-28 DIAGNOSIS — R002 Palpitations: Secondary | ICD-10-CM

## 2022-11-28 DIAGNOSIS — I493 Ventricular premature depolarization: Secondary | ICD-10-CM

## 2022-11-28 DIAGNOSIS — Z79899 Other long term (current) drug therapy: Secondary | ICD-10-CM

## 2022-11-28 MED ORDER — METOPROLOL SUCCINATE ER 25 MG PO TB24: 25.0000 mg | ORAL_TABLET | Freq: Every day | ORAL | 1 refills | Status: DC

## 2022-11-28 NOTE — Telephone Encounter (Signed)
The patient has been notified of the result and verbalized understanding.  All questions (if any) were answered.  Pt aware to start taking Toprol XL 25 mg po daily.  She is aware that she will need an echo and lab work to check TSH, Mg level, and BMET.  She is aware to call us if she has any problems on the BB.   Confirmed the pharmacy of choice with the pt.   Pt aware that I will place the order for an echo in the system and send a message to our [facility] Scheduling team to call her back and arrange both her echo and lab appt.  Pt states she would like her labs done same day as she comes in for the echo, for commute reasons.   Scheduled the pt a 4-5 week follow-up appt (first available with Dr. Shari Prows) for 12/31/22 at 1140.    Pt verbalized understanding and agrees with this plan.  Will send this message back to Dr. Mayford Knife to make her aware that the pt wants her echo and lab done same day and she will see Dr. Shari Prows on 12/31/22.

## 2022-11-28 NOTE — Telephone Encounter (Signed)
-----   Message from Quintella Reichert, MD sent at 11/28/2022  5:39 PM EST ----- Heart monitor showed extra heart beats from the bottom of the heart called PVCs.  Please start toprol XL 25mg  daily and followup with Dr. in 3-4 weeks.  Call if any problems on the BB.  Please set up for 2D echo to assess LVF

## 2022-11-28 NOTE — Telephone Encounter (Signed)
Quintella Reichert, MD 11/28/2022  5:39 PM EST Back to Top    Also have her come in for a BMET, Mag and TSH

## 2022-12-26 ENCOUNTER — Ambulatory Visit: Payer: 59

## 2022-12-26 ENCOUNTER — Ambulatory Visit (HOSPITAL_COMMUNITY): Payer: 59 | Attending: Internal Medicine

## 2022-12-26 DIAGNOSIS — I493 Ventricular premature depolarization: Secondary | ICD-10-CM

## 2022-12-26 DIAGNOSIS — Z79899 Other long term (current) drug therapy: Secondary | ICD-10-CM

## 2022-12-26 DIAGNOSIS — I10 Essential (primary) hypertension: Secondary | ICD-10-CM | POA: Insufficient documentation

## 2022-12-26 DIAGNOSIS — R002 Palpitations: Secondary | ICD-10-CM | POA: Diagnosis present

## 2022-12-26 DIAGNOSIS — R9431 Abnormal electrocardiogram [ECG] [EKG]: Secondary | ICD-10-CM

## 2022-12-26 LAB — ECHOCARDIOGRAM COMPLETE
Area-P 1/2: 3.42 cm2
S' Lateral: 2.8 cm

## 2022-12-26 LAB — BASIC METABOLIC PANEL
BUN/Creatinine Ratio: 16 (ref 12–28)
BUN: 14 mg/dL (ref 8–27)
CO2: 27 mmol/L (ref 20–29)
Calcium: 9.2 mg/dL (ref 8.7–10.3)
Chloride: 103 mmol/L (ref 96–106)
Creatinine, Ser: 0.87 mg/dL (ref 0.57–1.00)
Glucose: 91 mg/dL (ref 70–99)
Potassium: 4.1 mmol/L (ref 3.5–5.2)
Sodium: 141 mmol/L (ref 134–144)
eGFR: 75 mL/min/{1.73_m2} (ref >59)

## 2022-12-26 LAB — TSH: TSH: 4.35 u[IU]/mL (ref 0.450–4.500)

## 2022-12-26 LAB — MAGNESIUM: Magnesium: 2.2 mg/dL (ref 1.6–2.3)

## 2022-12-31 ENCOUNTER — Ambulatory Visit: Payer: 59 | Admitting: Cardiology

## 2023-01-26 NOTE — Progress Notes (Unsigned)
Cardiology Office Note:    Date:  01/29/2023   ID:  Tammy Barrera, DOB Oct 08, 1961, MRN [id number]  PCP:  [facility]   [facility] Providers Cardiologist:  None {  Referring MD: [facility]*    History of Present Illness:    Tammy Barrera is a 62 y.o. female with a hx of HTN and HLD who presents to clinic for follow-up.  Was initially referred to clinic in 04/2022 for CV risk stratification. Ca score 24 (75%). She was started on crestor at that time.  Was last seen in clinic in 10/2022 where she was complaining of palpitations. Cardiac monitor showed occasional PVCs (3.2%) which correlated with symptoms. Follow-up TTE 12/2022 showed LVEF 60-65%, G1DD, normal RV, trivial MR. She was started on metop 59m XL daily.  Today, the patient overall feels well today. Palpitations are better controlled on the metoprolol. No chest pain, SOB, orthopnea, PND, lightheadedness or dizziness. Occasional LE edema usually related to salt intake. Trying to remains active and walks her dog regularly and feels well with activity. Tolerating medications and BP is well controlled at home.   Past Medical History:  Diagnosis Date   Caregiver stress    Family history of diabetes mellitus (DM)    GERD (gastroesophageal reflux disease)    Hyperlipidemia    Hypertension    Migraines    Obesity    Vitamin D deficiency     No past surgical history on file.  Current Medications: Current Meds  Medication Sig   MAGNESIUM-ZINC PO Take 1 tablet by mouth daily.   Multiple Vitamin (MULTIVITAMIN PO) Take 1 tablet by mouth daily.   Thiamine HCl (THIAMINE PO) Take 1 tablet by mouth daily.   VITAMIN D PO Take 1 tablet by mouth daily.   [DISCONTINUED] metoprolol succinate (TOPROL XL) 25 MG 24 hr tablet Take 1 tablet (25 mg total) by mouth daily.   [DISCONTINUED] rosuvastatin (CRESTOR) 10 MG tablet Take 1 tablet (10 mg total) by mouth daily.   [DISCONTINUED]  valsartan (DIOVAN) 160 MG tablet Take 1 tablet (160 mg total) by mouth daily.     Allergies:   Patient has no known allergies.   Social History   Socioeconomic History   Marital status: Married    Spouse name: Not on file   Number of children: Not on file   Years of education: Not on file   Highest education level: Not on file  Occupational History   Not on file  Tobacco Use   Smoking status: Never   Smokeless tobacco: Never  Substance and Sexual Activity   Alcohol use: Not on file   Drug use: Not on file   Sexual activity: Not on file  Other Topics Concern   Not on file  Social History Narrative   Not on file   Social Determinants of Health   Financial Resource Strain: Not on file  Food Insecurity: Not on file  Transportation Needs: Not on file  Physical Activity: Not on file  Stress: Not on file  Social Connections: Not on file     Family History: The patient's family history includes Other in her father.  ROS:   Review of Systems  Constitutional:  Negative for chills and fever.  HENT:  Negative for ear pain.   Eyes:  Negative for photophobia.  Respiratory:  Negative for hemoptysis, sputum production and stridor.   Cardiovascular:  Positive for palpitations. Negative for chest pain, orthopnea, claudication, leg swelling and PND.  Gastrointestinal:  Negative for melena and vomiting.  Genitourinary:  Negative for flank pain.  Musculoskeletal:  Negative for falls.  Neurological:  Negative for sensory change and seizures.  Endo/Heme/Allergies:  Negative for polydipsia.  Psychiatric/Behavioral:  Negative for memory loss and substance abuse.      EKGs/Labs/Other Studies Reviewed:    The following studies were reviewed today: TTE 01-15-2023: IMPRESSIONS     1. Left ventricular ejection fraction, by estimation, is 60 to 65%. Left  ventricular ejection fraction by PLAX is 64 %. The left ventricle has  normal function. The left ventricle has no regional wall  motion  abnormalities. There is mild left ventricular  hypertrophy. Left ventricular diastolic parameters are consistent with  Grade I diastolic dysfunction (impaired relaxation).   2. Right ventricular systolic function is normal. The right ventricular  size is normal.   3. The mitral valve is abnormal. Trivial mitral valve regurgitation.  There is mild late systolic bowing without diagnostic prolapse of both  leaflets of the mitral valve.   4. The aortic valve is tricuspid. Aortic valve regurgitation is not  visualized.   Comparison(s): No prior Echocardiogram.   Cardiac Monitor 11/2022:     Predominant rhythm was normal sinus rhythm with average heart rate 94bpm and ranged from 60 to 155bpm.   Occasional PVCs, ventricular couplets and triplets and ventricular bigeminy and trigeminy were present  PVC load 3.2%.   PVCs correlated with patient's symptoms.     Patch Wear Time:  6 days and 22 hours (2023-12-01T21:41:50-0500 to 2023-12-08T20:35:02-499)   Patient had a min HR of 60 bpm, max HR of 155 bpm, and avg HR of 94 bpm. Predominant underlying rhythm was Sinus Rhythm. Isolated SVEs were rare (<1.0%), SVE Triplets were rare (<1.0%), and no SVE Couplets were present. Isolated VEs were occasional  (3.2%, 30049), VE Couplets were rare (<1.0%, 65), and VE Triplets were rare (<1.0%, 6). Ventricular Bigeminy and Trigeminy were present.   Ca score 05/2022: FINDINGS: Coronary arteries: Normal origins.   Coronary Calcium Score:   Left main: 0   Left anterior descending artery: 0   Left circumflex artery: 21   Right coronary artery: 3   Total: 24   Percentile: 75th   Pericardium: Normal.   Aorta: Normal caliber of ascending aorta. Aortic atherosclerosis noted.   Non-cardiac: See separate report from [facility].   IMPRESSION: Coronary calcium score of 24. This was 75th percentile for age-, race-, and sex-matched controls. Aortic atherosclerosis.   EKG:  EKG  not performed today.   Recent Labs: 12/26/2022: BUN 14; Creatinine, Ser 0.87; Magnesium 2.2; Potassium 4.1; Sodium 141; TSH 4.350   Recent Lipid Panel    Component Value Date/Time   CHOL 138 07/20/2022 1056   TRIG 71 07/20/2022 1056   HDL 61 07/20/2022 1056   CHOLHDL 2.3 07/20/2022 1056   LDLCALC 63 07/20/2022 1056     Risk Assessment/Calculations:           Physical Exam:    VS:  BP 138/88 (BP Location: Left Arm, Patient Position: Sitting, Cuff Size: Large) Comment: At home this morning 116/78  Pulse 96   Ht 5' 4"$  (1.626 m)   Wt 186 lb 4 oz (84.5 kg)   SpO2 98%   BMI 31.97 kg/m     Wt Readings from Last 3 Encounters:  01/29/23 186 lb 4 oz (84.5 kg)  11/13/22 185 lb 12.8 oz (84.3 kg)  05/09/22 188 lb 9.6 oz (85.5 kg)     GEN:  Comfortable, NAD HEENT: Normal NECK: No JVD; No carotid bruits CARDIAC: RRR, no murmurs RESPIRATORY:  Clear to auscultation without rales, wheezing or rhonchi  ABDOMEN: Soft, non-tender, non-distended MUSCULOSKELETAL:  No edema; No deformity  SKIN: Warm and dry NEUROLOGIC:  Alert and oriented x 3 PSYCHIATRIC:  Normal affect   ASSESSMENT:    1. Primary hypertension   2. Palpitations   3. PVC's (premature ventricular contractions)   4. Coronary artery calcification seen on CT scan   5. Holter monitor, abnormal   6. Medication management   7. Hypertension, unspecified type   8. Family history of early CAD   54. Hyperlipidemia, unspecified hyperlipidemia type      PLAN:    In order of problems listed above:  #PVCs: Patient with 3.2% burden of PVCs on cardiac monitor that correlated with symptoms. TTE with normal LVEF 60-65%, no valve disease. Currently, symptoms much better controlled on metop. -Continue metop 73m XL daily  #HTN: Blood pressure well controlled at home. -Continue valsartan 1660mdaily -Continue metop 2531mL daily -Goal BP <130/90  #HLD: -Continue crestor 37m37mily -LDL well controlled at 63  #CAD: Ca  score 24 (75%). No anginal symptoms.  -Continue crestor 37mg35mly       Follow-up:  1 year.  Medication Adjustments/Labs and Tests Ordered: Current medicines are reviewed at length with the patient today.  Concerns regarding medicines are outlined above.   No orders of the defined types were placed in this encounter.  Meds ordered this encounter  Medications   metoprolol succinate (TOPROL XL) 25 MG 24 hr tablet    Sig: Take 1 tablet (25 mg total) by mouth daily.    Dispense:  90 tablet    Refill:  1   rosuvastatin (CRESTOR) 10 MG tablet    Sig: Take 1 tablet (10 mg total) by mouth daily.    Dispense:  90 tablet    Refill:  2   valsartan (DIOVAN) 160 MG tablet    Sig: Take 1 tablet (160 mg total) by mouth daily.    Dispense:  90 tablet    Refill:  2    DOSE INCREASE   Patient Instructions  Medication Instructions:  Your physician recommends that you continue on your current medications as directed. Please refer to the Current Medication list given to you today.  *If you need a refill on your cardiac medications before your next appointment, please call your pharmacy*  Follow-Up: At [facility] and your health needs are our priority.  As part of our continuing mission to provide you with exceptional heart care, we have created designated Provider Care Teams.  These Care Teams include your primary Cardiologist (physician) and Advanced Practice Providers (APPs -  Physician Assistants and Nurse Practitioners) who all work together to provide you with the care you need, when you need it.    Your next appointment:   1 year(s)  Provider:   Dr. PembeJohney Frame Signed, HeathFreada Bergeron 01/29/2023 9:26 AM    [facility]

## 2023-01-29 ENCOUNTER — Ambulatory Visit: Payer: 59 | Attending: Cardiology | Admitting: Cardiology

## 2023-01-29 ENCOUNTER — Encounter: Payer: Self-pay | Admitting: Cardiology

## 2023-01-29 VITALS — BP 138/88 | HR 96 | Ht 64.0 in | Wt 186.2 lb

## 2023-01-29 DIAGNOSIS — Z8249 Family history of ischemic heart disease and other diseases of the circulatory system: Secondary | ICD-10-CM

## 2023-01-29 DIAGNOSIS — I493 Ventricular premature depolarization: Secondary | ICD-10-CM | POA: Diagnosis not present

## 2023-01-29 DIAGNOSIS — I10 Essential (primary) hypertension: Secondary | ICD-10-CM

## 2023-01-29 DIAGNOSIS — I2510 Atherosclerotic heart disease of native coronary artery without angina pectoris: Secondary | ICD-10-CM | POA: Diagnosis not present

## 2023-01-29 DIAGNOSIS — E785 Hyperlipidemia, unspecified: Secondary | ICD-10-CM

## 2023-01-29 DIAGNOSIS — Z79899 Other long term (current) drug therapy: Secondary | ICD-10-CM

## 2023-01-29 DIAGNOSIS — R002 Palpitations: Secondary | ICD-10-CM

## 2023-01-29 DIAGNOSIS — R9431 Abnormal electrocardiogram [ECG] [EKG]: Secondary | ICD-10-CM

## 2023-01-29 MED ORDER — VALSARTAN 160 MG PO TABS: 160.0000 mg | ORAL_TABLET | Freq: Every day | ORAL | 2 refills | Status: DC

## 2023-01-29 MED ORDER — METOPROLOL SUCCINATE ER 25 MG PO TB24: 25.0000 mg | ORAL_TABLET | Freq: Every day | ORAL | 1 refills | Status: DC

## 2023-01-29 MED ORDER — ROSUVASTATIN CALCIUM 10 MG PO TABS: 10.0000 mg | ORAL_TABLET | Freq: Every day | ORAL | 2 refills | Status: DC

## 2023-01-29 NOTE — Patient Instructions (Signed)
Medication Instructions:  Your physician recommends that you continue on your current medications as directed. Please refer to the Current Medication list given to you today.  *If you need a refill on your cardiac medications before your next appointment, please call your pharmacy*  Follow-Up: At [facility], you and your health needs are our priority.  As part of our continuing mission to provide you with exceptional heart care, we have created designated Provider Care Teams.  These Care Teams include your primary Cardiologist (physician) and Advanced Practice Providers (APPs -  Physician Assistants and Nurse Practitioners) who all work together to provide you with the care you need, when you need it.    Your next appointment:   1 year(s)  Provider:   Dr. Johney Frame

## 2023-09-11 DIAGNOSIS — L821 Other seborrheic keratosis: Secondary | ICD-10-CM | POA: Insufficient documentation

## 2023-09-17 ENCOUNTER — Other Ambulatory Visit: Payer: Self-pay

## 2023-09-17 DIAGNOSIS — Z79899 Other long term (current) drug therapy: Secondary | ICD-10-CM

## 2023-09-17 DIAGNOSIS — I493 Ventricular premature depolarization: Secondary | ICD-10-CM

## 2023-09-17 DIAGNOSIS — Z8249 Family history of ischemic heart disease and other diseases of the circulatory system: Secondary | ICD-10-CM

## 2023-09-17 DIAGNOSIS — I10 Essential (primary) hypertension: Secondary | ICD-10-CM

## 2023-09-17 DIAGNOSIS — I2510 Atherosclerotic heart disease of native coronary artery without angina pectoris: Secondary | ICD-10-CM

## 2023-09-17 DIAGNOSIS — R9431 Abnormal electrocardiogram [ECG] [EKG]: Secondary | ICD-10-CM

## 2023-09-17 DIAGNOSIS — R002 Palpitations: Secondary | ICD-10-CM

## 2023-09-17 DIAGNOSIS — E785 Hyperlipidemia, unspecified: Secondary | ICD-10-CM

## 2023-09-17 MED ORDER — VALSARTAN 160 MG PO TABS: 160.0000 mg | ORAL_TABLET | Freq: Every day | ORAL | 1 refills | Status: DC

## 2023-09-17 MED ORDER — METOPROLOL SUCCINATE ER 25 MG PO TB24: 25.0000 mg | ORAL_TABLET | Freq: Every day | ORAL | 1 refills | Status: DC

## 2023-11-28 ENCOUNTER — Other Ambulatory Visit: Payer: Self-pay

## 2023-11-28 DIAGNOSIS — Z8249 Family history of ischemic heart disease and other diseases of the circulatory system: Secondary | ICD-10-CM

## 2023-11-28 DIAGNOSIS — Z79899 Other long term (current) drug therapy: Secondary | ICD-10-CM

## 2023-11-28 DIAGNOSIS — I2510 Atherosclerotic heart disease of native coronary artery without angina pectoris: Secondary | ICD-10-CM

## 2023-11-28 DIAGNOSIS — E785 Hyperlipidemia, unspecified: Secondary | ICD-10-CM

## 2023-11-28 MED ORDER — ROSUVASTATIN CALCIUM 10 MG PO TABS: 10.0000 mg | ORAL_TABLET | Freq: Every day | ORAL | 0 refills | Status: DC

## 2024-02-05 ENCOUNTER — Ambulatory Visit: Payer: 59 | Admitting: Cardiology

## 2024-02-09 IMAGING — CT CT CARDIAC CORONARY ARTERY CALCIUM SCORE
3 series · 14 of 20 positions shown, 16 images · non-contrast
Comparison: None Available.
COMPARISON: None Available.

Addendum:
EXAM:
OVER-READ INTERPRETATION  CT CHEST

The following report is a limited chest CT over-read performed by
05/23/2022. The coronary calcium score interpretation by the
cardiologist is attached.
CLINICAL DATA: Cardiovascular Disease Risk stratification
Coronary Calcium Score
TECHNIQUE: A gated, non-contrast computed tomography scan of the heart was
performed using 3mm slice thickness. Axial images were analyzed on a
dedicated workstation. Calcium scoring of the coronary arteries was
performed using the Agatston method.

[Series 2: cascseq 2.0 sa36 (id) (id) · axial · 0.39mm/px · z∈[-208,-128]mm · 4 of 68 slices shown]
[im 14/68  vessel]
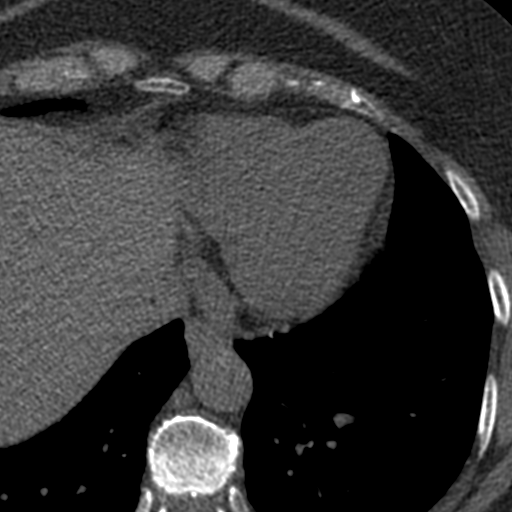
[im 27/68  vessel]
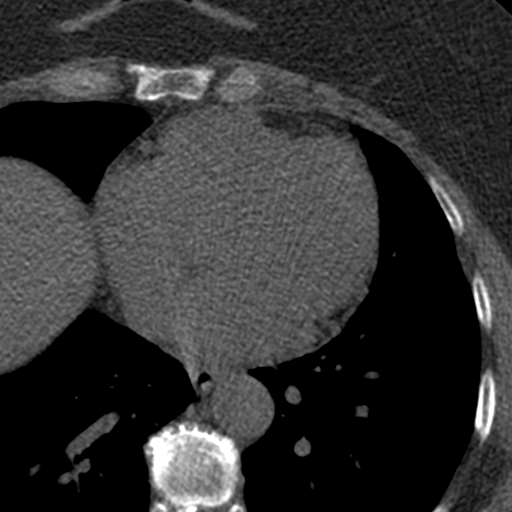
[im 41/68  vessel]
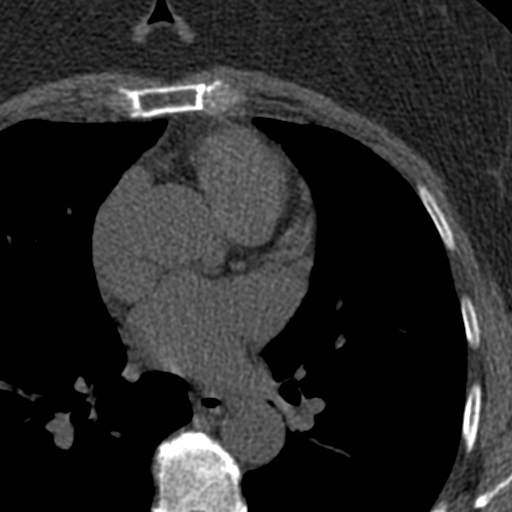
[im 54/68  vessel]
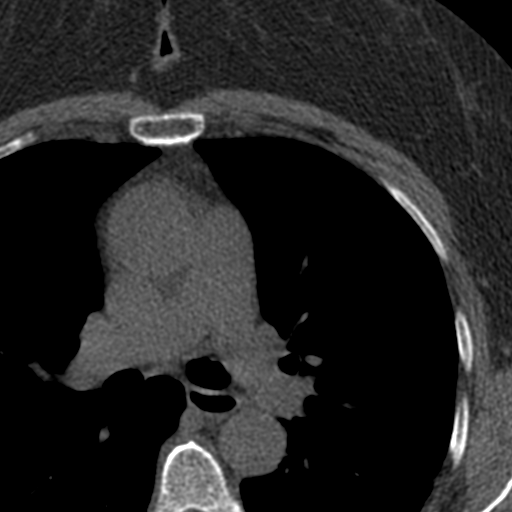

[Series 3: cascseq 2.0 bf37 st · axial · 0.67mm/px · z∈[-212,-124]mm · 5 of 68 slices shown, 7 images]
[im 12/68  vessel]
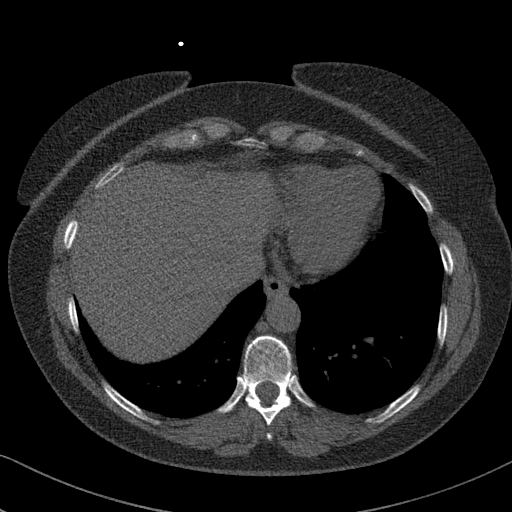
[im 12/68  lung]
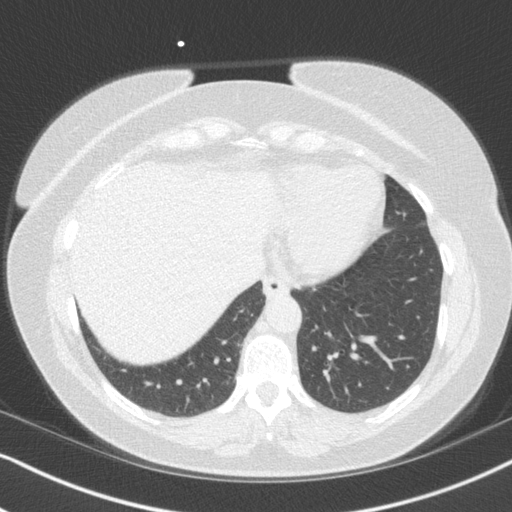
[im 23/68  vessel]
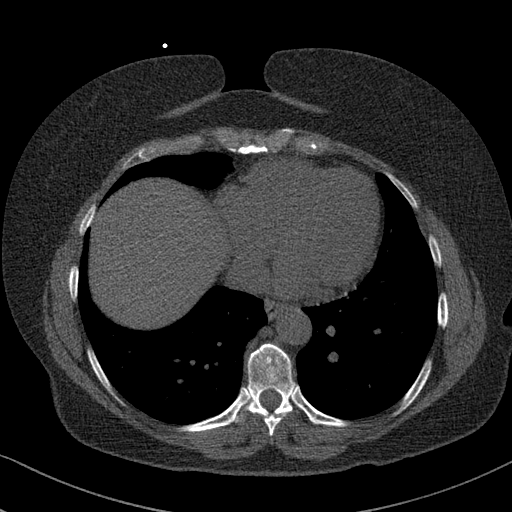
[im 34/68  vessel]
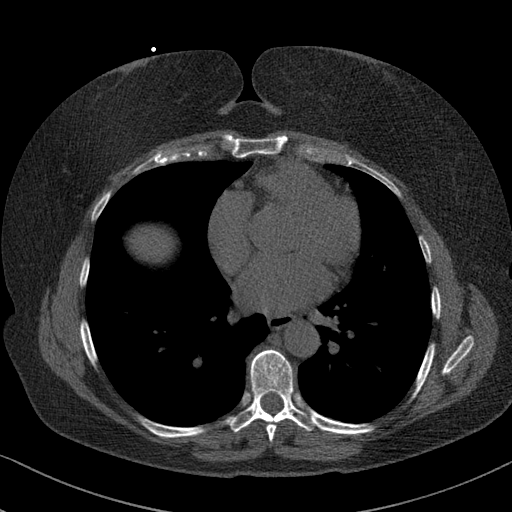
[im 45/68  vessel]
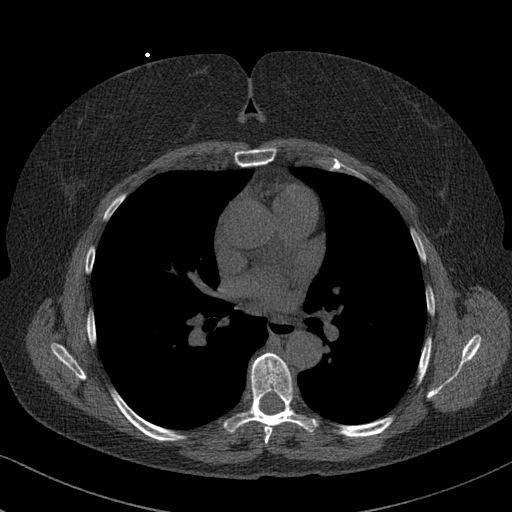
[im 56/68  vessel]
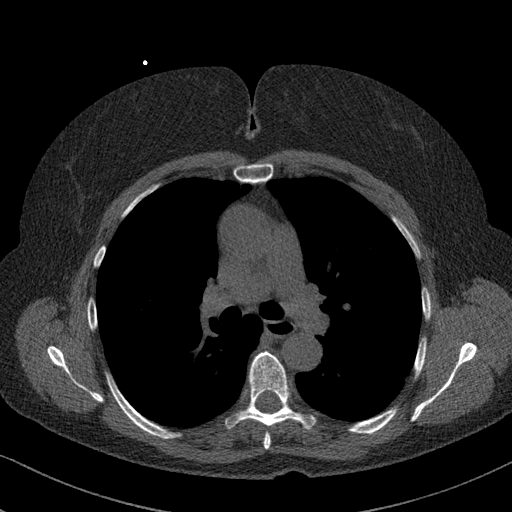
[im 56/68  lung]
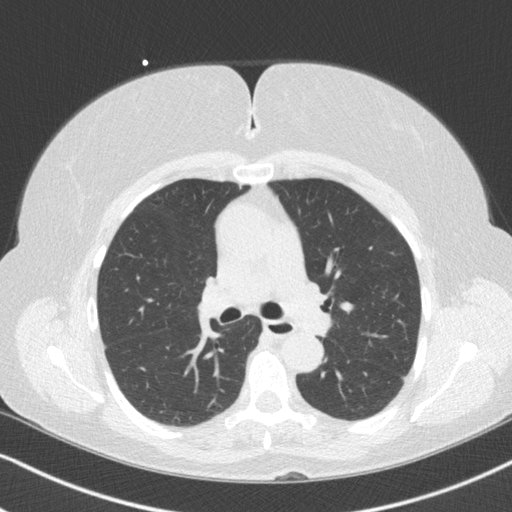

[Series 4: cascseq 2.0 br59 lung · axial · 0.67mm/px · z∈[-212,-124]mm · 5 of 68 slices shown]
[im 12/68  lung]
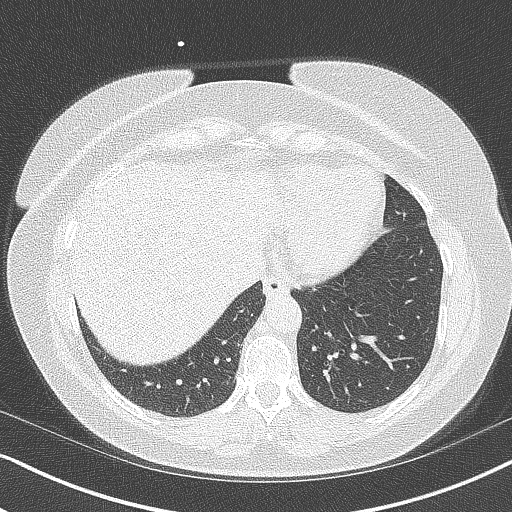
[im 23/68  lung]
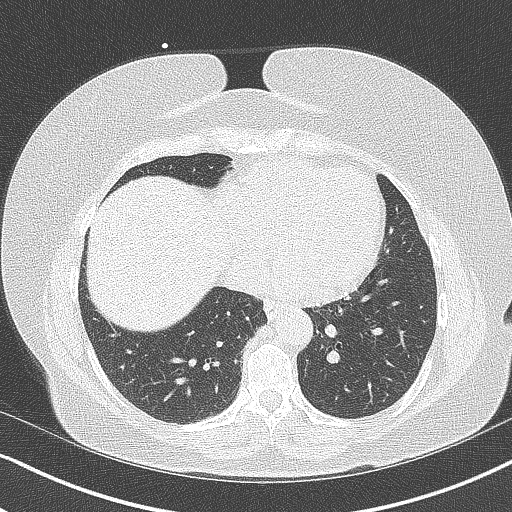
[im 34/68  lung]
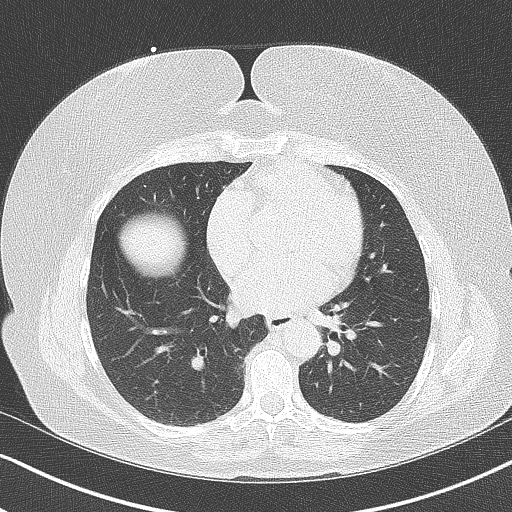
[im 45/68  lung]
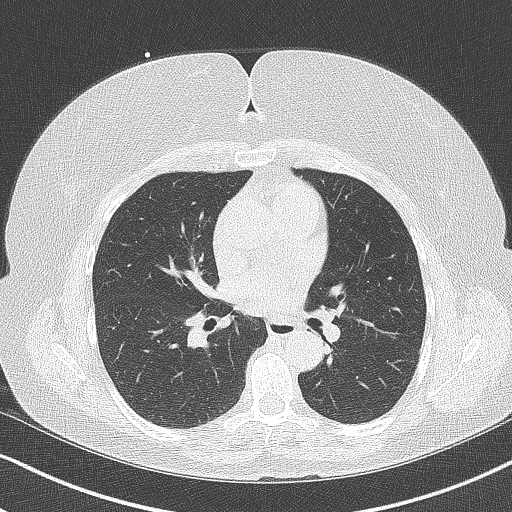
[im 56/68  lung]
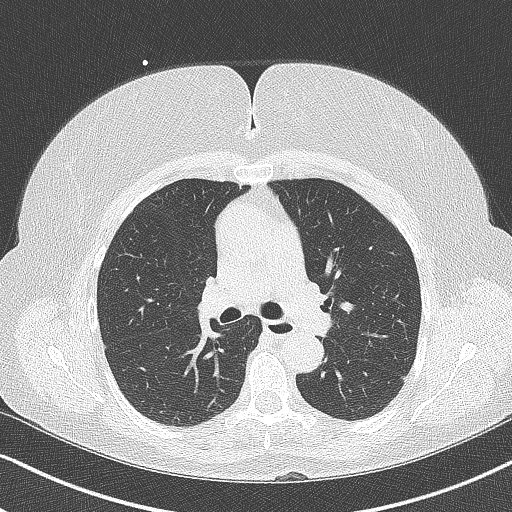

[14 of 20 positions shown; findings below may reference images not displayed]

FINDINGS: Vascular: Normal caliber ascending thoracic aorta.

Mediastinum/Nodes: No mediastinal or hilar mass or adenopathy. The
esophagus is unremarkable.

Lungs/Pleura: No worrisome pulmonary lesions or pulmonary nodules.
No acute pulmonary findings. No pleural effusions.

Upper Abdomen: No significant upper abdominal findings.

Musculoskeletal: No significant bony findings.
IMPRESSION: No significant extracardiac findings.
FINDINGS: Coronary arteries: Normal origins.

Coronary Calcium Score:

Left main: 0

Left anterior descending artery: 0

Left circumflex artery: 21

Right coronary artery: 3

Total: 24

Percentile: 75th

Pericardium: Normal.

Aorta: Normal caliber of ascending aorta. Aortic atherosclerosis
noted.

Non-cardiac: See separate report from [REDACTED].
IMPRESSION: Coronary calcium score of 24. This was 75th percentile for age-,
race-, and sex-matched controls. Aortic atherosclerosis.



If CAC=0, it is reasonable to withhold statin therapy and reassess
in 5 to 10 years, as long as higher risk conditions are absent
(diabetes mellitus, family history of premature CHD in first degree
relatives (males <55 years; females <65 years), cigarette smoking,
or LDL >=190 mg/dL).

If CAC is 1 to 99, it is reasonable to initiate statin therapy for
patients >=55 years of age.

If CAC is >=100 or >=75th percentile, it is reasonable to initiate
statin therapy at any age.

Cardiology referral should be considered for patients with CAC
scores >=400 or >=75th percentile.

*0653 AHA/ACC/AACVPR/AAPA/ABC/RUDI/PR/NADEGE/Spiro/SCHULTS/NIEMI/KEAGORATA
Guideline on the Management of Blood Cholesterol: A Report of the
American College of Cardiology/American Heart Association Task Force
on Clinical Practice Guidelines. J Am Coll Cardiol.
3224;73(24):2078-2737.

*** End of Addendum ***
EXAM:
OVER-READ INTERPRETATION  CT CHEST

The following report is a limited chest CT over-read performed by
05/23/2022. The coronary calcium score interpretation by the
cardiologist is attached.
FINDINGS: Vascular: Normal caliber ascending thoracic aorta.

Mediastinum/Nodes: No mediastinal or hilar mass or adenopathy. The
esophagus is unremarkable.

Lungs/Pleura: No worrisome pulmonary lesions or pulmonary nodules.
No acute pulmonary findings. No pleural effusions.

Upper Abdomen: No significant upper abdominal findings.

Musculoskeletal: No significant bony findings.
IMPRESSION: No significant extracardiac findings.

## 2024-03-11 ENCOUNTER — Encounter: Payer: Self-pay | Admitting: Cardiology

## 2024-03-11 ENCOUNTER — Ambulatory Visit: Payer: 59 | Attending: Cardiology | Admitting: Cardiology

## 2024-03-11 VITALS — BP 166/110 | HR 85 | Ht 64.0 in | Wt 200.0 lb

## 2024-03-11 DIAGNOSIS — I10 Essential (primary) hypertension: Secondary | ICD-10-CM

## 2024-03-11 DIAGNOSIS — R002 Palpitations: Secondary | ICD-10-CM | POA: Diagnosis not present

## 2024-03-11 DIAGNOSIS — E785 Hyperlipidemia, unspecified: Secondary | ICD-10-CM

## 2024-03-11 DIAGNOSIS — E559 Vitamin D deficiency, unspecified: Secondary | ICD-10-CM | POA: Diagnosis not present

## 2024-03-11 DIAGNOSIS — R0683 Snoring: Secondary | ICD-10-CM

## 2024-03-11 DIAGNOSIS — I2510 Atherosclerotic heart disease of native coronary artery without angina pectoris: Secondary | ICD-10-CM

## 2024-03-11 DIAGNOSIS — Z131 Encounter for screening for diabetes mellitus: Secondary | ICD-10-CM

## 2024-03-11 DIAGNOSIS — I493 Ventricular premature depolarization: Secondary | ICD-10-CM

## 2024-03-11 MED ORDER — VALSARTAN 160 MG PO TABS: 160.0000 mg | ORAL_TABLET | Freq: Two times a day (BID) | ORAL | 3 refills | Status: DC

## 2024-03-11 NOTE — Progress Notes (Signed)
 Cardiology Office Note:    Date:  03/14/2024   ID:  Tammy Barrera, DOB 01/04/1961, MRN [id number]  PCP:  [facility]ola Cord [facility]  Cardiologist:  None  Electrophysiologist:  None   Referring MD: Beola Cord [facility]*   Chief Complaint  Patient presents with   Advice Only    WatchPAT issued to patient in the comment box.    " I am ok"  History of Present Illness:    Tammy Barrera is a 63 y.o. female with a hx of Hypertension, hyperlipidemia, occassional PVC, coronary calcium scoreon CT here today for follow up visit. She previously followed with Dr. Shari Prows. This is my fist visit with her.  She presents with concerns about her fluctuating blood pressure and weight issues. She reports that her blood pressure often ranges around 140/70, which she acknowledges is not normal. She has been taking valsartan and metoprolol for her PVCs. She also has a family history of diabetes and has been monitoring her blood sugar levels at home, although she has not had official testing. She expresses a desire to improve her health and is open to changes in her medication regimen. She also reports a history of snoring and has not been tested for sleep apnea. She is currently struggling with weight issues, which she believes may be contributing to her health problems.  Past Medical History:  Diagnosis Date   Caregiver stress    Family history of diabetes mellitus (DM)    GERD (gastroesophageal reflux disease)    Hyperlipidemia    Hypertension    Migraines    Obesity    Vitamin D deficiency     No past surgical history on file.  Current Medications: Current Meds  Medication Sig   MAGNESIUM-ZINC PO Take 1 tablet by mouth daily.   metoprolol succinate (TOPROL XL) 25 MG 24 hr tablet Take 1 tablet (25 mg total) by mouth daily.   Multiple Vitamin (MULTIVITAMIN PO) Take 1 tablet by mouth daily.   rosuvastatin (CRESTOR) 10 MG tablet Take 1 tablet (10 mg total) by mouth  daily.   Thiamine HCl (THIAMINE PO) Take 1 tablet by mouth daily.   valsartan (DIOVAN) 160 MG tablet Take 1 tablet (160 mg total) by mouth 2 (two) times daily.   VITAMIN D PO Take 1 tablet by mouth daily.   [DISCONTINUED] valsartan (DIOVAN) 160 MG tablet Take 1 tablet (160 mg total) by mouth daily.     Allergies:   Patient has no known allergies.   Social History   Socioeconomic History   Marital status: Married    Spouse name: Not on file   Number of children: Not on file   Years of education: Not on file   Highest education level: Not on file  Occupational History   Not on file  Tobacco Use   Smoking status: Never   Smokeless tobacco: Never  Substance and Sexual Activity   Alcohol use: Not on file   Drug use: Not on file   Sexual activity: Not on file  Other Topics Concern   Not on file  Social History Narrative   Not on file   Social Drivers of Health   Financial Resource Strain: Low Risk  (09/08/2023)   [facility]   Overall Financial Resource Strain (CARDIA)    Difficulty of Paying Living Expenses: Not hard at all  Food Insecurity: No Food Insecurity (09/08/2023)   Received from [facility]   Hunger Vital Sign    Worried  About Running Out of Food in the Last Year: Never true    Ran Out of Food in the Last Year: Never true  Transportation Needs: No Transportation Needs (09/08/2023)   Received from [facility] - Transportation    Lack of Transportation (Medical): No    Lack of Transportation (Non-Medical): No  Physical Activity: Unknown (09/08/2023)   Received from [facility]   Exercise Vital Sign    Days of Exercise per Week: 1 day    Minutes of Exercise per Session: Not on file  Stress: No Stress Concern Present (09/08/2023)   Received from [facility] - Occupational Stress Questionnaire    Feeling of Stress : Not at all  Social Connections: Unknown (04/29/2023)   Received from [facility]   Social Network    Social Network: Not on file     Family History: The patient's family history includes Other in her father.  ROS:   Review of Systems  Constitution: Negative for decreased appetite, fever and weight gain.  HENT: Negative for congestion, ear discharge, hoarse voice and sore throat.   Eyes: Negative for discharge, redness, vision loss in right eye and visual halos.  Cardiovascular: Negative for chest pain, dyspnea on exertion, leg swelling, orthopnea and palpitations.  Respiratory: Negative for cough, hemoptysis, shortness of breath and snoring.   Endocrine: Negative for heat intolerance and polyphagia.  Hematologic/Lymphatic: Negative for bleeding problem. Does not bruise/bleed easily.  Skin: Negative for flushing, nail changes, rash and suspicious lesions.  Musculoskeletal: Negative for arthritis, joint pain, muscle cramps, myalgias, neck pain and stiffness.  Gastrointestinal: Negative for abdominal pain, bowel incontinence, diarrhea and excessive appetite.  Genitourinary: Negative for decreased libido, genital sores and incomplete emptying.  Neurological: Negative for brief paralysis, focal weakness, headaches and loss of balance.  Psychiatric/Behavioral: Negative for altered mental status, depression and suicidal ideas.  Allergic/Immunologic: Negative for HIV exposure and persistent infections.    EKGs/Labs/Other Studies Reviewed:    The following studies were reviewed today:   EKG:  The ekg ordered today demonstrates sinus rhythm, Hr 85 bpm with left atrial enlargement   Recent Labs: No results found for requested labs within last 365 days.  Recent Lipid Panel    Component Value Date/Time   CHOL 161 03/11/2024 1027   TRIG 74 03/11/2024 1027   HDL 57 03/11/2024 1027   CHOLHDL 2.8 03/11/2024 1027   LDLCALC 90 03/11/2024 1027    Physical Exam:    VS:  BP (!) 166/110   Pulse 85   Ht 5\' 4"  (1.626 m)   Wt 200 lb (90.7 kg)   SpO2 94%   BMI  34.33 kg/m     Wt Readings from Last 3 Encounters:  03/11/24 200 lb (90.7 kg)  01/29/23 186 lb 4 oz (84.5 kg)  11/13/22 185 lb 12.8 oz (84.3 kg)     GEN: Well nourished, well developed in no acute distress HEENT: Normal NECK: No JVD; No carotid bruits LYMPHATICS: No lymphadenopathy CARDIAC: S1S2 noted,RRR, no murmurs, rubs, gallops RESPIRATORY:  Clear to auscultation without rales, wheezing or rhonchi  ABDOMEN: Soft, non-tender, non-distended, +bowel sounds, no guarding. EXTREMITIES: No edema, No cyanosis, no clubbing MUSCULOSKELETAL:  No deformity  SKIN: Warm and dry NEUROLOGIC:  Alert and oriented x 3, non-focal PSYCHIATRIC:  Normal affect, good insight  ASSESSMENT:    1. Primary hypertension   2. Palpitations   3. Snoring   4. Vitamin D deficiency  5. Screening for diabetes mellitus (DM)   6. Hyperlipidemia, unspecified hyperlipidemia type   7. PVC's (premature ventricular contractions)   8. Morbid obesity (HCC)   9. Coronary artery calcification seen on CT scan    PLAN:    Hypertension Hypertension not well-controlled with current regimen. Discussed importance of achieving BP <130 mmHg to prevent complications. Considered increasing valsartan or switching metoprolol to Cardizem. - Increase valsartan to 320 mg ( taken as 160 mg twice a day). - Monitor blood pressure daily and upload readings after two weeks. - Consider switching metoprolol to Cardizem if needed.  Coronary Artery Disease (CAD) Coronary artery disease managed with lifestyle modifications and medication. Emphasized controlling risk factors to prevent progression.  Premature Ventricular Contractions (PVCs) PVCs managed with metoprolol. Consider switch to Cardizem for dual management of PVCs and hypertension. If blood pressure remains elevated  Obesity Weight management issues contributing to hypertension and potential sleep apnea. Discussed lifestyle changes and dietary modifications. - Provide  dietary guidance, including reducing late-night snacking and monitoring salt intake. - Provide 'salty six' information to help reduce dietary sodium.  Potential Sleep Apnea Snoring may indicate sleep apnea, contributing to resistant hypertension. Discussed cardiovascular impact and need for diagnosis. - Order home sleep study to evaluate for sleep apnea.  Screening for Diabetes Mellitus Family history and weight issues warrant diabetes screening. - Order hemoglobin A1c to assess for diabetes.  Varicose Veins Leg swelling due to varicose veins, not painful, no intervention needed unless symptoms worsen.  Vitamin D Deficiency Low vitamin D levels, monitoring required to ensure adequacy. - Check vitamin D levels to assess current status.  Follow-up Ongoing monitoring and treatment adjustment necessary to prevent complications. - Follow up in 12-16 weeks for reassessment. - Review blood pressure readings after two weeks via MyChart. - Communicate via MyChart for ongoing management and adjustments.  The patient is in agreement with the above plan. The patient left the office in stable condition.  The patient will follow up in   Medication Adjustments/Labs and Tests Ordered: Current medicines are reviewed at length with the patient today.  Concerns regarding medicines are outlined above.  Orders Placed This Encounter  Procedures   Lipid panel   Hemoglobin A1c   VITAMIN D 25 Hydroxy (Vit-D Deficiency, Fractures)   EKG 12-Lead   [facility] Sleep Study   Meds ordered this encounter  Medications   valsartan (DIOVAN) 160 MG tablet    Sig: Take 1 tablet (160 mg total) by mouth 2 (two) times daily.    Dispense:  180 tablet    Refill:  3    Patient Instructions  Medication Instructions:  Your physician has recommended you make the following change in your medication:  START: Valsartan 160 mg twice daily *If you need a refill on your cardiac medications before your next appointment,  please call your pharmacy*   Lab Work: Lipid, HgbA1c, Vit D If you have labs (blood work) drawn today and your tests are completely normal, you will receive your results only by: MyChart Message (if you have MyChart) OR A paper copy in the mail If you have any lab test that is abnormal or we need to change your treatment, we will call you to review the results.   Testing/Procedures: WatchPAT? is an FDA-cleared portable home sleep study test that uses a watch and 3 points of contact to monitor 7 different channels, including your heart rate, oxygen saturation, body position, snoring, and chest motion.  The study is easy to use from  the comfort of your own home and accurately detect sleep apnea.  Before bed, you attach the chest sensor, attached the sleep apnea bracelet to your nondominant hand, and attach the finger probe.  After the study, the raw data is downloaded from the watch and scored for apnea events.   For more information: [email or web address]  Patient Testing Instructions:  Once our office has received insurance approval for you to complete this test, we will contact you with a PIN to activate the device.  This typically takes 2-3 weeks.  Please do not open the box until approved.   Do not put battery into the device until bedtime when you are ready to begin the test. Please call the support number if you need assistance after following the instructions below: 24 hour support line- [phone] or [facility] support at [phone] (option 2)  Download the [employer] One" app through the [employer] or [employer]. Be sure to turn on or enable access to Bluetooth in settings on your smartphone. Make sure no other Bluetooth devices are on and within the vicinity of your smartphone and WatchPAT watch during testing.  Make sure to leave your smart phone plugged in and charging all night.  When ready for bed:  Follow the instructions step by step in the  WatchPAT One app to activate the testing device. For additional instructions, including video instruction, visit the WatchPAT One video on Youtube. You can search for WatchPAT One within Youtube (video is 4 minutes and 18 seconds) or enter: https://youtube/watch?[email or web address]_vbiwxE Please note: You will be prompted to enter a PIN to connect via Bluetooth when starting the test. The PIN will be assigned to you after insurance has approved the test.  The device is disposable, but it recommended that you retain the device until you receive a call letting you know the study has been received and the results have been interpreted.  We will let you know if the study did not transmit to [place] properly after the test is completed. You do not need to call us to confirm the receipt of the test.  Please complete the test within 48 hours of receiving PIN.   Frequently Asked Questions:  What is Watch PAT One?  A single use, fully disposable home sleep apnea testing device and will not need to be returned after completion.  What are the requirements to use WatchPAT One?  A successful WatchPAT One sleep study requires a WatchPAT One device, your smart phone, WatchPAT One app, your PIN number, and internet access. What type of phone do I need?  You should have a smart phone that uses Android 5.1 and above or any iPhone with IOS 10 and above. How can I download the WatchPAT one app?  Based on your device type search for WatchPAT One app either in [employer] for [employer] or [employer] for [employer]. Where will I get my PIN for the study?  Your PIN will be provided by your physician's office after insurance has approved the test. This process typically takes 2-3 weeks. It is used for authentication and if you lose/forget your PIN, please reach out to your provider's office.  I do not have internet at home. Can I still complete a WatchPAT One study?  WatchPAT One needs internet connection throughout the night  to be able to transmit the sleep data. You can use your home/local internet or your cellular data package. However, it is always recommended to use home/local internet.  It is estimated that between 20MB-30MB of data will be used with each study, but the application will be looking for space in the phone to start the study.  What happens if I lose internet or Bluetooth connection?  During the internet disconnection, your phone will not be able to transmit the sleep data.  All the data, will be stored in your phone.  As soon as the internet connection is back on, the phone will resume sending the sleep data. During the Bluetooth disconnection, WatchPAT One will not be able to to send the sleep data to your phone.  Data will be kept in the WatchPAT One until both devices have Bluetooth connection back on.  As soon as the connection is back on, WatchPAT one will send the sleep data to the phone.  How long do I need to wear the WatchPAT one?  After you start the study, you should wear the device at least 6 hours.  How far should I keep my phone from the device?  During the night, your phone should remain within 15 feet of where you sleep.  What happens if I leave the room for restroom or other reasons?  Leaving the room for any reason will not cause any problem. As soon as your get back to the room, both devices will reconnect and will continue to send the sleep data. Can I use my phone during the sleep study?  Yes, you can use your phone as usual during the study. But it is recommended to put your WatchPAT One on when you are ready to go to bed.  How will I get my study results?  A soon as you completed your study, your sleep data will be sent to the provider. They will then share the results with you when they are ready.     Follow-Up: At [facility], you and your health needs are our priority.  As part of our continuing mission to provide you with exceptional heart care, we have  created designated Provider Care Teams.  These Care Teams include your primary Cardiologist (physician) and Advanced Practice Providers (APPs -  Physician Assistants and Nurse Practitioners) who all work together to provide you with the care you need, when you need it.   Your next appointment:   12-16 week(s)  Provider:   Thomasene Ripple, DO   Other Instructions Please take your blood pressure daily for 2 weeks and send in a MyChart message. Please include heart rates. (One message at the end of the 2 weeks).   HOW TO TAKE YOUR BLOOD PRESSURE: Rest 5 minutes before taking your blood pressure. Don't smoke or drink caffeinated beverages for at least 30 minutes before. Take your blood pressure before (not after) you eat. Sit comfortably with your back supported and both feet on the floor (don't cross your legs). Elevate your arm to heart level on a table or a desk. Use the proper sized cuff. It should fit smoothly and snugly around your bare upper arm. There should be enough room to slip a fingertip under the cuff. The bottom edge of the cuff should be 1 inch above the crease of the elbow. Ideally, take 3 measurements at one sitting and record the average.            Adopting a Healthy Lifestyle.  Know what a healthy weight is for you (roughly BMI <25) and aim to maintain this   Aim for 7+ servings of fruits and vegetables daily  65-80+ fluid ounces of water or unsweet tea for healthy kidneys   Limit to max 1 drink of alcohol per day; avoid smoking/tobacco   Limit animal fats in diet for cholesterol and heart health - choose grass fed whenever available   Avoid highly processed foods, and foods high in saturated/trans fats   Aim for low stress - take time to unwind and care for your mental health   Aim for 150 min of moderate intensity exercise weekly for heart health, and weights twice weekly for bone health   Aim for 7-9 hours of sleep daily   When it comes to diets,  agreement about the perfect plan isnt easy to find, even among the experts. Experts at the [facility] [employer] developed an idea known as the Healthy Eating Plate. Just imagine a plate divided into logical, healthy portions.   The emphasis is on diet quality:   Load up on vegetables and fruits - one-half of your plate: Aim for color and variety, and remember that potatoes dont count.   Go for whole grains - one-quarter of your plate: Whole wheat, barley, wheat berries, quinoa, oats, brown rice, and foods made with them. If you want pasta, go with whole wheat pasta.   Protein power - one-quarter of your plate: Fish, chicken, beans, and nuts are all healthy, versatile protein sources. Limit red meat.   The diet, however, does go beyond the plate, offering a few other suggestions.   Use healthy plant oils, such as olive, canola, soy, corn, sunflower and peanut. Check the labels, and avoid partially hydrogenated oil, which have unhealthy trans fats.   If youre thirsty, drink water. Coffee and tea are good in moderation, but skip sugary drinks and limit milk and dairy products to one or two daily servings.   The type of carbohydrate in the diet is more important than the amount. Some sources of carbohydrates, such as vegetables, fruits, whole grains, and beans-are healthier than others.   Finally, stay active  Signed, Thomasene Ripple, DO  03/14/2024 11:29 PM     [facility]                      Patient agreement reviewed and signed on 03/14/2024.  WatchPAT issued to patient on 03/14/2024 by Jeb Levering, RN. Patient aware to not open the WatchPAT box until contacted with the activation PIN. Patient profile initialized in CloudPAT on 03/14/2024 by Jeb Levering, RN. Device serial number: [id number]

## 2024-03-11 NOTE — Patient Instructions (Signed)
 Medication Instructions:  Your physician has recommended you make the following change in your medication:  START: Valsartan 160 mg twice daily *If you need a refill on your cardiac medications before your next appointment, please call your pharmacy*   Lab Work: Lipid, HgbA1c, Vit D If you have labs (blood work) drawn today and your tests are completely normal, you will receive your results only by: MyChart Message (if you have MyChart) OR A paper copy in the mail If you have any lab test that is abnormal or we need to change your treatment, we will call you to review the results.   Testing/Procedures: WatchPAT? is an FDA-cleared portable home sleep study test that uses a watch and 3 points of contact to monitor 7 different channels, including your heart rate, oxygen saturation, body position, snoring, and chest motion.  The study is easy to use from the comfort of your own home and accurately detect sleep apnea.  Before bed, you attach the chest sensor, attached the sleep apnea bracelet to your nondominant hand, and attach the finger probe.  After the study, the raw data is downloaded from the watch and scored for apnea events.   For more information: [email or web address]  Patient Testing Instructions:  Once our office has received insurance approval for you to complete this test, we will contact you with a PIN to activate the device.  This typically takes 2-3 weeks.  Please do not open the box until approved.   Do not put battery into the device until bedtime when you are ready to begin the test. Please call the support number if you need assistance after following the instructions below: 24 hour support line- [phone] or [facility] support at [phone] (option 2)  Download the [facility] [facility]" app through the [employer] or [employer]. Be sure to turn on or enable access to Bluetooth in settings on your smartphone. Make sure no other Bluetooth  devices are on and within the vicinity of your smartphone and WatchPAT watch during testing.  Make sure to leave your smart phone plugged in and charging all night.  When ready for bed:  Follow the instructions step by step in the WatchPAT One app to activate the testing device. For additional instructions, including video instruction, visit the WatchPAT One video on Youtube. You can search for WatchPAT One within Youtube (video is 4 minutes and 18 seconds) or enter: [email or web address] Please note: You will be prompted to enter a PIN to connect via Bluetooth when starting the test. The PIN will be assigned to you after insurance has approved the test.  The device is disposable, but it recommended that you retain the device until you receive a call letting you know the study has been received and the results have been interpreted.  We will let you know if the study did not transmit to [place] properly after the test is completed. You do not need to call us to confirm the receipt of the test.  Please complete the test within 48 hours of receiving PIN.   Frequently Asked Questions:  What is Watch PAT One?  A single use, fully disposable home sleep apnea testing device and will not need to be returned after completion.  What are the requirements to use WatchPAT One?  A successful WatchPAT One sleep study requires a WatchPAT One device, your smart phone, WatchPAT One app, your PIN number, and internet access. What type of phone do I need?  You should have a smart  phone that uses Android 5.1 and above or any iPhone with IOS 10 and above. How can I download the WatchPAT one app?  Based on your device type search for WatchPAT One app either in [employer] for [employer] or [employer] for [employer]. Where will I get my PIN for the study?  Your PIN will be provided by your physician's office after insurance has approved the test. This process typically takes 2-3 weeks. It is used  for authentication and if you lose/forget your PIN, please reach out to your provider's office.  I do not have internet at home. Can I still complete a WatchPAT One study?  WatchPAT One needs internet connection throughout the night to be able to transmit the sleep data. You can use your home/local internet or your cellular data package. However, it is always recommended to use home/local internet. It is estimated that between 20MB-30MB of data will be used with each study, but the application will be looking for space in the phone to start the study.  What happens if I lose internet or Bluetooth connection?  During the internet disconnection, your phone will not be able to transmit the sleep data.  All the data, will be stored in your phone.  As soon as the internet connection is back on, the phone will resume sending the sleep data. During the Bluetooth disconnection, WatchPAT One will not be able to to send the sleep data to your phone.  Data will be kept in the WatchPAT One until both devices have Bluetooth connection back on.  As soon as the connection is back on, WatchPAT one will send the sleep data to the phone.  How long do I need to wear the WatchPAT one?  After you start the study, you should wear the device at least 6 hours.  How far should I keep my phone from the device?  During the night, your phone should remain within 15 feet of where you sleep.  What happens if I leave the room for restroom or other reasons?  Leaving the room for any reason will not cause any problem. As soon as your get back to the room, both devices will reconnect and will continue to send the sleep data. Can I use my phone during the sleep study?  Yes, you can use your phone as usual during the study. But it is recommended to put your WatchPAT One on when you are ready to go to bed.  How will I get my study results?  A soon as you completed your study, your sleep data will be sent to the provider. They will  then share the results with you when they are ready.     Follow-Up: At [facility], you and your health needs are our priority.  As part of our continuing mission to provide you with exceptional heart care, we have created designated Provider Care Teams.  These Care Teams include your primary Cardiologist (physician) and Advanced Practice Providers (APPs -  Physician Assistants and Nurse Practitioners) who all work together to provide you with the care you need, when you need it.   Your next appointment:   12-16 week(s)  Provider:   Thomasene Ripple, DO   Other Instructions Please take your blood pressure daily for 2 weeks and send in a MyChart message. Please include heart rates. (One message at the end of the 2 weeks).   HOW TO TAKE YOUR BLOOD PRESSURE: Rest 5 minutes before  taking your blood pressure. Don't smoke or drink caffeinated beverages for at least 30 minutes before. Take your blood pressure before (not after) you eat. Sit comfortably with your back supported and both feet on the floor (don't cross your legs). Elevate your arm to heart level on a table or a desk. Use the proper sized cuff. It should fit smoothly and snugly around your bare upper arm. There should be enough room to slip a fingertip under the cuff. The bottom edge of the cuff should be 1 inch above the crease of the elbow. Ideally, take 3 measurements at one sitting and record the average.

## 2024-03-12 LAB — VITAMIN D 25 HYDROXY (VIT D DEFICIENCY, FRACTURES): Vit D, 25-Hydroxy: 74.8 ng/mL (ref 30.0–100.0)

## 2024-03-12 LAB — LIPID PANEL
Chol/HDL Ratio: 2.8 ratio (ref 0.0–4.4)
Cholesterol, Total: 161 mg/dL (ref 100–199)
HDL: 57 mg/dL (ref >39)
LDL Chol Calc (NIH): 90 mg/dL (ref 0–99)
Triglycerides: 74 mg/dL (ref 0–149)
VLDL Cholesterol Cal: 14 mg/dL (ref 5–40)

## 2024-03-12 LAB — HEMOGLOBIN A1C
Est. average glucose Bld gHb Est-mCnc: 120 mg/dL
Hgb A1c MFr Bld: 5.8 % — ABNORMAL HIGH (ref 4.8–5.6)

## 2024-03-17 ENCOUNTER — Encounter: Payer: Self-pay | Admitting: Cardiology

## 2024-03-26 ENCOUNTER — Encounter: Payer: Self-pay | Admitting: Cardiology

## 2024-03-31 ENCOUNTER — Other Ambulatory Visit: Payer: Self-pay | Admitting: Cardiology

## 2024-03-31 DIAGNOSIS — E785 Hyperlipidemia, unspecified: Secondary | ICD-10-CM

## 2024-03-31 DIAGNOSIS — I2510 Atherosclerotic heart disease of native coronary artery without angina pectoris: Secondary | ICD-10-CM

## 2024-03-31 DIAGNOSIS — Z8249 Family history of ischemic heart disease and other diseases of the circulatory system: Secondary | ICD-10-CM

## 2024-03-31 DIAGNOSIS — Z79899 Other long term (current) drug therapy: Secondary | ICD-10-CM

## 2024-04-02 NOTE — Progress Notes (Signed)
 Ordering provider: K. Tobb Associated diagnoses: Snoring WatchPAT PA obtained on 04/02/2024 by Maceo Sax, LPN. Authorization: Yes; tracking ID [id number]. Patient notified of PIN (1234) on 04/02/2024 via Notification Method: phone.

## 2024-04-06 ENCOUNTER — Encounter (INDEPENDENT_AMBULATORY_CARE_PROVIDER_SITE_OTHER): Payer: Self-pay | Admitting: Cardiology

## 2024-04-06 DIAGNOSIS — G4733 Obstructive sleep apnea (adult) (pediatric): Secondary | ICD-10-CM

## 2024-04-14 ENCOUNTER — Ambulatory Visit: Attending: Cardiology

## 2024-04-14 DIAGNOSIS — R0683 Snoring: Secondary | ICD-10-CM

## 2024-04-14 NOTE — Procedures (Signed)
   SLEEP STUDY REPORT Patient Information Study Date: 04/06/2024 Patient Name: Tammy Barrera Patient ID: [id number] Birth Date: October 17, 1961 Age: 63 Gender: Female BMI: 34.3 (W=200 lb, H=5' 4'') Referring Physician: Kardie Tobb, DO  TEST DESCRIPTION: Home sleep apnea testing was completed using the WatchPat, a Type 1 device, utilizing peripheral arterial tonometry (PAT), chest movement, actigraphy, pulse oximetry, pulse rate, body position and snore. AHI was calculated with apnea and hypopnea using valid sleep time as the denominator. RDI includes apneas, hypopneas, and RERAs. The data acquired and the scoring of sleep and all associated events were performed in accordance with the recommended standards and specifications as outlined in the AASM Manual for the Scoring of Sleep and Associated Events 2.2.0 (2015).  FINDINGS:  1. Mild Obstructive Sleep Apnea with AHI 6.5/hr overall and moderate during REM sleep with REM AHI 17/hr.  2. Central Sleep Apnea with pAHIc 0/hr.  3. Oxygen desaturations as low as 82%.  4. Mild to moderate snoring was present. O2 sats were < 88% for 1.5 min.  5. Total sleep time was 7 hrs and 7 min.  6. 30.7% of total sleep time was spent in REM sleep.  7. Normal sleep onset latency at 24 min.  8. Shortened REM sleep onset latency at 64 min.  9. Total awakenings were 6. 10. Arrhythmia detection: None  DIAGNOSIS: Mild Obstructive Sleep Apnea (G47.33) overall and Moderate Obstructive Sleep Apnea during REM sleep  RECOMMENDATIONS: 1. Clinical correlation of these findings is necessary. The decision to treat obstructive sleep apnea (OSA) is usually based on the presence of apnea symptoms or the presence of associated medical conditions such as Hypertension, Congestive Heart Failure, Atrial Fibrillation or Obesity. The most common symptoms of OSA are snoring, gasping for breath while sleeping, daytime sleepiness and fatigue. 2. Initiating apnea therapy is  recommended given the presence of symptoms and/or associated conditions. Recommend proceeding with one of the following:  a. Auto-CPAP therapy with a pressure range of 5-20cm H2O.  b. An oral appliance (OA) that can be obtained from certain dentists with expertise in sleep medicine. These are primarily of use in non-obese patients with mild and moderate disease.  c. An ENT consultation which may be useful to look for specific causes of obstruction and possible treatment options.  d. If patient is intolerant to PAP therapy, consider referral to ENT for evaluation for hypoglossal nerve stimulator. 3. Close follow-up is necessary to ensure success with CPAP or oral appliance therapy for maximum benefit . 4. A follow-up oximetry study on CPAP is recommended to assess the adequacy of therapy and determine the need for supplemental oxygen or the potential need for Bi-level therapy. An arterial blood gas to determine the adequacy of baseline ventilation and oxygenation should also be considered. 5. Healthy sleep recommendations include: adequate nightly sleep (normal 7-9 hrs/night), avoidance of caffeine after noon and alcohol near bedtime, and maintaining a sleep environment that is cool, dark and quiet. 6. Weight loss for overweight patients is recommended. Even modest amounts of weight loss can significantly improve the severity of sleep apnea. 7. Snoring recommendations include: weight loss where appropriate, side sleeping, and avoidance of alcohol before bed. 8. Operation of motor vehicle should be avoided when sleepy.  Signature: Gaylyn Keas, MD; [facility]; Diplomat, American Board of Sleep Medicine Electronically Signed: 04/14/2024 7:25:53 PM

## 2024-04-15 ENCOUNTER — Encounter: Payer: Self-pay | Admitting: Cardiology

## 2024-04-16 ENCOUNTER — Telehealth: Payer: Self-pay | Admitting: *Deleted

## 2024-04-16 NOTE — Telephone Encounter (Signed)
-----   Message from Gaylyn Keas sent at 04/14/2024  7:27 PM EDT ----- Please let patient know that they have sleep apnea and recommend treating with CPAP.  Please order an auto CPAP from 4-15cm H2O with heated humidity and mask of choice.  Order overnight pulse ox on CPAP.  Followup with me in 6 weeks.

## 2024-04-16 NOTE — Telephone Encounter (Signed)
 The patient has been notified of the result and verbalized understanding.  All questions (if any) were answered. Gaylene Kays, CMA 04/16/2024 9:51 AM.   Patient would like to speak to her PCP before agreeing to the cpap then she will call us  back.

## 2024-04-28 ENCOUNTER — Ambulatory Visit: Payer: Self-pay | Admitting: Cardiology

## 2024-05-06 ENCOUNTER — Other Ambulatory Visit: Payer: Self-pay

## 2024-05-06 DIAGNOSIS — G4733 Obstructive sleep apnea (adult) (pediatric): Secondary | ICD-10-CM

## 2024-05-06 NOTE — Progress Notes (Signed)
 Pulmonary referral placed per Dr. Ryan Coyer request.

## 2024-06-27 ENCOUNTER — Other Ambulatory Visit: Payer: Self-pay | Admitting: Cardiology

## 2024-06-27 DIAGNOSIS — Z8249 Family history of ischemic heart disease and other diseases of the circulatory system: Secondary | ICD-10-CM

## 2024-06-27 DIAGNOSIS — Z79899 Other long term (current) drug therapy: Secondary | ICD-10-CM

## 2024-06-27 DIAGNOSIS — I2510 Atherosclerotic heart disease of native coronary artery without angina pectoris: Secondary | ICD-10-CM

## 2024-06-27 DIAGNOSIS — E785 Hyperlipidemia, unspecified: Secondary | ICD-10-CM

## 2024-07-10 ENCOUNTER — Other Ambulatory Visit: Payer: Self-pay

## 2024-07-10 DIAGNOSIS — R9431 Abnormal electrocardiogram [ECG] [EKG]: Secondary | ICD-10-CM

## 2024-07-10 DIAGNOSIS — I493 Ventricular premature depolarization: Secondary | ICD-10-CM

## 2024-07-10 DIAGNOSIS — R002 Palpitations: Secondary | ICD-10-CM

## 2024-07-10 DIAGNOSIS — Z79899 Other long term (current) drug therapy: Secondary | ICD-10-CM

## 2024-07-10 DIAGNOSIS — I10 Essential (primary) hypertension: Secondary | ICD-10-CM

## 2024-07-10 MED ORDER — METOPROLOL SUCCINATE ER 25 MG PO TB24: 25.0000 mg | ORAL_TABLET | Freq: Every day | ORAL | 2 refills | Status: DC

## 2024-07-14 ENCOUNTER — Ambulatory Visit: Admitting: Cardiology

## 2024-07-15 ENCOUNTER — Ambulatory Visit: Admitting: Cardiology

## 2024-08-27 ENCOUNTER — Telehealth: Payer: Self-pay | Admitting: Cardiology

## 2024-08-27 DIAGNOSIS — I2510 Atherosclerotic heart disease of native coronary artery without angina pectoris: Secondary | ICD-10-CM

## 2024-08-27 DIAGNOSIS — E785 Hyperlipidemia, unspecified: Secondary | ICD-10-CM

## 2024-08-27 DIAGNOSIS — Z8249 Family history of ischemic heart disease and other diseases of the circulatory system: Secondary | ICD-10-CM

## 2024-08-27 DIAGNOSIS — Z79899 Other long term (current) drug therapy: Secondary | ICD-10-CM

## 2024-08-27 MED ORDER — ROSUVASTATIN CALCIUM 10 MG PO TABS: 10.0000 mg | ORAL_TABLET | Freq: Every day | ORAL | 1 refills | Status: AC

## 2024-08-27 MED ORDER — ROSUVASTATIN CALCIUM 10 MG PO TABS: 10.0000 mg | ORAL_TABLET | Freq: Every day | ORAL | 1 refills | Status: DC

## 2024-08-27 NOTE — Telephone Encounter (Signed)
*  STAT* If patient is at the pharmacy, call can be transferred to refill team.   1. Which medications need to be refilled? (please list name of each medication and dose if known) rosuvastatin  (CRESTOR ) 10 MG tablet   2. Which pharmacy/location (including street and city if local pharmacy) is medication to be sent to?  [id number] [place], [place] - [address]    3. Do they need a 30 day or 90 day supply? 90

## 2024-08-27 NOTE — Telephone Encounter (Signed)
 Pt's medication was sent to pt's pharmacy as requested. Confirmation received.

## 2024-08-27 NOTE — Telephone Encounter (Signed)
*  STAT* If patient is at the pharmacy, call can be transferred to refill team.   1. Which medications need to be refilled? (please list name of each medication and dose if known)   rosuvastatin  (CRESTOR ) 10 MG tablet     2. Would you like to learn more about the convenience, safety, & potential cost savings by using the [facility]? No   3. Are you open to using the [facility] (Type [facility]. No   4. Which pharmacy/location (including street and city if local pharmacy) is medication to be sent to?[id number] [place],  - [address]    5. Do they need a 30 day or 90 day supply? 90 day

## 2024-08-27 NOTE — Telephone Encounter (Signed)
 RX sent in

## 2024-09-02 ENCOUNTER — Ambulatory Visit: Admitting: Pulmonary Disease

## 2024-09-29 ENCOUNTER — Encounter: Payer: Self-pay | Admitting: *Deleted

## 2024-10-01 ENCOUNTER — Ambulatory Visit (INDEPENDENT_AMBULATORY_CARE_PROVIDER_SITE_OTHER)

## 2024-10-01 VITALS — BP 138/84 | HR 93 | Ht 64.0 in | Wt 196.0 lb

## 2024-10-01 DIAGNOSIS — Z6833 Body mass index (BMI) 33.0-33.9, adult: Secondary | ICD-10-CM

## 2024-10-01 DIAGNOSIS — G4733 Obstructive sleep apnea (adult) (pediatric): Secondary | ICD-10-CM

## 2024-10-01 DIAGNOSIS — E66811 Obesity, class 1: Secondary | ICD-10-CM | POA: Diagnosis not present

## 2024-10-01 NOTE — Patient Instructions (Addendum)
 Notification of test results are managed in the following manner: If there are any recommendations or changes to the plan of care discussed in office today, we will contact you and let you know what they are. If you do not hear from us , then your results are normal/expected and you can view them through your MyChart account, or a letter will be sent to you. Thank you again for trusting us  with your care [facility].  Contact one of these offices to discuss your sleep apnea treatment       [email or web address]       [email or web address]   - Maintain side sleeping. - Maintain fixed sleep-wake schedule even during weekends, avoid naps during the day. - Avoid alcohol, caffeine and nicotine at least 4 hours before bedtime. - Avoid watching TV, reading on your phone or computer to-3 hours before bedtime. - Use bed only for sleeping or sexual activity.

## 2024-10-01 NOTE — Progress Notes (Signed)
 New Patient Pulmonology Office Visit   Subjective:  Patient ID: Tammy Barrera, female    DOB: 11/06/1961  MRN: [id number]  Referred by: Tobb, Kardie, DO  CC:  Chief Complaint  Patient presents with   Consult    PT states recently done sleep test     HPI Tammy Barrera is a 63 y.o. female with hypertension, hyperlipidemia presents for evaluation of OSA.  Note from PCP on 09/11/2023 reviewed.  OSA history: HST done on April 2025 showed desaturation 1.2 minutes, O2 nadir 85%, 4% AHI 6.5 and 3% at 9.3.  V-shaped desaturation seen.  Supine dominant.  Symptoms: BP control has been a challenge.   Mouth breather: closed.  Preferred sleeping position: side but does go on back.   Sleep related Symptoms:  Snoring- yes Witnessed apnea- no Gasping/choking- no morning HA/dry mouth- no/no tired on awakening, excessive daytime sleepiness- no Restless legs- no No parasomnia.   Sleep routine:  -Bed: 10-11p -Nocturnal awakenings: 0-2 times. No nocturia.  -Wake: 5.30a.  -Napping:no -sleep hygiene: TV, reading.   Social Hist/Habits:  -Caffeine: in morning and afternoon.  -Alcohol: no -Nicotine:no -Occupation: Loss adjuster, chartered. No chemical exposures.       10/01/2024    3:00 PM  Results of the Epworth flowsheet  Sitting and reading 2  Watching TV 1  Sitting, inactive in a public place (e.g. a theatre or a meeting) 0  As a passenger in a car for an hour without a break 3  Lying down to rest in the afternoon when circumstances permit 0  Sitting and talking to someone 0  Sitting quietly after a lunch without alcohol 0  In a car, while stopped for a few minutes in traffic 0  Total score 6    Allergies: Patient has no known allergies.  Current Outpatient Medications:    MAGNESIUM-ZINC PO, Take 1 tablet by mouth daily., Disp: , Rfl:    metoprolol  succinate (TOPROL  XL) 25 MG 24 hr tablet, Take 1 tablet (25 mg total) by mouth daily., Disp: 90 tablet, Rfl: 2   Multiple  Vitamin (MULTIVITAMIN PO), Take 1 tablet by mouth daily., Disp: , Rfl:    rosuvastatin  (CRESTOR ) 10 MG tablet, Take 1 tablet (10 mg total) by mouth daily., Disp: 90 tablet, Rfl: 1   Thiamine HCl (THIAMINE PO), Take 1 tablet by mouth daily., Disp: , Rfl:    valsartan  (DIOVAN ) 160 MG tablet, Take 1 tablet (160 mg total) by mouth 2 (two) times daily., Disp: 180 tablet, Rfl: 3   VITAMIN D  PO, Take 1 tablet by mouth daily., Disp: , Rfl:  Past Medical History:  Diagnosis Date   Caregiver stress    Family history of diabetes mellitus (DM)    GERD (gastroesophageal reflux disease)    Hyperlipidemia    Hypertension    Migraines    Obesity    Vitamin D  deficiency    No past surgical history on file. Family History  Problem Relation Age of Onset   Other Father        BYPASS   Social History   Socioeconomic History   Marital status: Married    Spouse name: Not on file   Number of children: Not on file   Years of education: Not on file   Highest education level: Not on file  Occupational History   Not on file  Tobacco Use   Smoking status: Never   Smokeless tobacco: Never  Substance and Sexual Activity   Alcohol use: Not on  file   Drug use: Not on file   Sexual activity: Not on file  Other Topics Concern   Not on file  Social History Narrative   Not on file   Social Drivers of Health   Financial Resource Strain: Low Risk  (09/08/2023)   [facility]   Overall Financial Resource Strain (CARDIA)    Difficulty of Paying Living Expenses: Not hard at all  Food Insecurity: No Food Insecurity (09/08/2023)   [facility]   Hunger Vital Sign    Within the past 12 months, you worried that your food would run out before you got the money to buy more.: Never true    Within the past 12 months, the food you bought just didn't last and you didn't have money to get more.: Never true  Transportation Needs: No Transportation Needs (09/08/2023)   Received from  [facility] - Transportation    Lack of Transportation (Medical): No    Lack of Transportation (Non-Medical): No  Physical Activity: Unknown (09/08/2023)   Received from [facility]   Exercise Vital Sign    On average, how many days per week do you engage in moderate to strenuous exercise (like a brisk walk)?: 1 day    Minutes of Exercise per Session: Not on file  Stress: No Stress Concern Present (09/08/2023)   Received from [facility] - Occupational Stress Questionnaire    Feeling of Stress : Not at all  Social Connections: Unknown (04/29/2023)   Received from [facility]   Social Network    Social Network: Not on file  Intimate Partner Violence: Not At Risk (09/08/2023)   Received from [facility]   HITS    Over the last 12 months how often did your partner physically hurt you?: Never    Over the last 12 months how often did your partner insult you or talk down to you?: Sometimes    Over the last 12 months how often did your partner threaten you with physical harm?: Never    Over the last 12 months how often did your partner scream or curse at you?: Sometimes       Objective:  BP 138/84   Pulse 93   Ht 5' 4 (1.626 m)   Wt 196 lb (88.9 kg)   SpO2 97%   BMI 33.64 kg/m  BMI Readings from Last 3 Encounters:  10/01/24 33.64 kg/m  03/11/24 34.33 kg/m  01/29/23 31.97 kg/m    Physical Exam: CONSTITUTIONAL: NAD, well-appearing NASAL/OROPHARYNX:  Normal mucosa. No septal deviation. No hypertrophy of inferior turbinates. Modified Mallampati score 4. Has couple of dental crowns.  CV: RRR s1s2 nl, no murmurs  RESP: Clear to auscultation, normal respiratory effort   NEURO: CN II/XII grossly intact PSYCH: Alert & oriented x 3, Euthymic, appropriate affect  Diagnostic Review:  Last metabolic panel Lab Results  Component Value Date   GLUCOSE 91 12/26/2022   NA 141 12/26/2022   K 4.1 12/26/2022   CL 103  12/26/2022   CO2 27 12/26/2022   BUN 14 12/26/2022   CREATININE 0.87 12/26/2022   EGFR 75 12/26/2022   CALCIUM  9.2 12/26/2022         Assessment & Plan:   Assessment & Plan OSA (obstructive sleep apnea) I discussed with the patient the pathophysiology of obstructive sleep apnea, its association with weight, and its negative effects on hypertension, diabetes, mental health, A-fib, stroke if  left untreated.  I briefly discussed the treatment options for obstructive sleep apnea  With patient having difficulty control blood pressure she may benefit from treating her OSA [uncertainty because the patient's sleep apnea is not severe and does not have significant desaturation] Patient does not want to be on CPAP. Provided references for dentist in the area for mandibular advancement device which she is willing to try. -Side sleep.     Obesity (BMI 30.0-34.9) Will like talk to PCP for weight loss referral.    She was counselled about not driving while drowsy which is common side effect of sleep related disorders.   No follow-ups on file.   I personally spent a total of 28 minutes in the care of the patient today including preparing to see the patient, getting/reviewing separately obtained history, performing a medically appropriate exam/evaluation, counseling and educating, placing orders, documenting clinical information in the EHR, independently interpreting results, and communicating results.   Zera Markwardt, MD

## 2024-10-02 ENCOUNTER — Encounter: Payer: Self-pay | Admitting: Cardiology

## 2024-10-02 ENCOUNTER — Ambulatory Visit: Attending: Cardiology | Admitting: Cardiology

## 2024-10-02 VITALS — BP 164/110 | HR 74 | Ht 64.0 in | Wt 197.9 lb

## 2024-10-02 DIAGNOSIS — I493 Ventricular premature depolarization: Secondary | ICD-10-CM

## 2024-10-02 DIAGNOSIS — R7303 Prediabetes: Secondary | ICD-10-CM

## 2024-10-02 DIAGNOSIS — I10 Essential (primary) hypertension: Secondary | ICD-10-CM

## 2024-10-02 MED ORDER — CARVEDILOL 6.25 MG PO TABS: 6.2500 mg | ORAL_TABLET | Freq: Two times a day (BID) | ORAL | 6 refills | Status: AC

## 2024-10-02 NOTE — Patient Instructions (Signed)
 Medication Instructions:   Stop Toprol    Start Carvedilol 6.25 mg  *If you need a refill on your cardiac medications before your next appointment, please call your pharmacy*   Lab Work: Not needed If you have labs (blood work) drawn today and your tests are completely normal, you will receive your results only by: MyChart Message (if you have MyChart) OR A paper copy in the mail If you have any lab test that is abnormal or we need to change your treatment, we will call you to review the results.   Testing/Procedures:  Not  needed  Follow-Up: At [facility], you and your health needs are our priority.  As part of our continuing mission to provide you with exceptional heart care, we have created designated Provider Care Teams.  These Care Teams include your primary Cardiologist (physician) and Advanced Practice Providers (APPs -  Physician Assistants and Nurse Practitioners) who all work together to provide you with the care you need, when you need it.     Your next appointment:   16 week(s)  The format for your next appointment:   In Person  Provider:   Kardie Tobb,MD  You have been referred to  [facility]  at 2 weeks  Other Instructions

## 2024-10-02 NOTE — Progress Notes (Signed)
 Cardiology Office Note:    Date:  10/02/2024   ID:  Tammy Barrera, DOB 10-03-1961, MRN [id number]  PCP:  [facility]urnell Balder [facility]  Cardiologist:  None  Electrophysiologist:  None   Referring MD: [facility]*    I am doing well   History of Present Illness:    Tammy Barrera is a 63 y.o. female with a hx of  Hypertension, Mild Sleep apnea, Hyperlipidemia, PVCs, obesity. At her last visit with me she was hypertensive, therefore I adjusted her antihypertensive: Kept her on toprol  but increased the valsartan  to 160 mg BID.   She is here today for a follow up visit.  She reports that her blood pressure readings at home range from 140/80 to 150/80 mmHg. She checks her blood pressure daily, with occasional missed mornings. Despite an increased dosage of valsartan , blood pressure remains uncontrolled.  Past Medical History:  Diagnosis Date   Caregiver stress    Family history of diabetes mellitus (DM)    GERD (gastroesophageal reflux disease)    Hyperlipidemia    Hypertension    Migraines    Obesity    Vitamin D  deficiency     No past surgical history on file.  Current Medications: Current Meds  Medication Sig   carvedilol (COREG) 6.25 MG tablet Take 1 tablet (6.25 mg total) by mouth 2 (two) times daily.   MAGNESIUM-ZINC PO Take 1 tablet by mouth daily.   Multiple Vitamin (MULTIVITAMIN PO) Take 1 tablet by mouth daily.   rosuvastatin  (CRESTOR ) 10 MG tablet Take 1 tablet (10 mg total) by mouth daily.   Thiamine HCl (THIAMINE PO) Take 1 tablet by mouth daily.   valsartan  (DIOVAN ) 160 MG tablet Take 1 tablet (160 mg total) by mouth 2 (two) times daily.   VITAMIN D  PO Take 1 tablet by mouth daily.   [DISCONTINUED] metoprolol  succinate (TOPROL  XL) 25 MG 24 hr tablet Take 1 tablet (25 mg total) by mouth daily.     Allergies:   Patient has no known allergies.   Social History   Socioeconomic History   Marital status: Married    Spouse  name: Not on file   Number of children: Not on file   Years of education: Not on file   Highest education level: Not on file  Occupational History   Not on file  Tobacco Use   Smoking status: Never   Smokeless tobacco: Never  Substance and Sexual Activity   Alcohol use: Not on file   Drug use: Not on file   Sexual activity: Not on file  Other Topics Concern   Not on file  Social History Narrative   Not on file   Social Drivers of Health   Financial Resource Strain: Low Risk  (09/08/2023)   [facility]   Overall Financial Resource Strain (CARDIA)    Difficulty of Paying Living Expenses: Not hard at all  Food Insecurity: No Food Insecurity (09/08/2023)   [facility] from [facility]   Hunger Vital Sign    Within the past 12 months, you worried that your food would run out before you got the money to buy more.: Never true    Within the past 12 months, the food you bought just didn't last and you didn't have money to get more.: Never true  Transportation Needs: No Transportation Needs (09/08/2023)   Received from [facility] - Transportation    Lack of Transportation (Medical): No    Lack of  Transportation (Non-Medical): No  Physical Activity: Unknown (09/08/2023)   [facility]   Exercise Vital Sign    On average, how many days per week do you engage in moderate to strenuous exercise (like a brisk walk)?: 1 day    Minutes of Exercise per Session: Not on file  Stress: No Stress Concern Present (09/08/2023)   Received from [facility] - Occupational Stress Questionnaire    Feeling of Stress : Not at all  Social Connections: Unknown (04/29/2023)   Received from [facility]   Social Network    Social Network: Not on file     Family History: The patient's family history includes Other in her father.  ROS:   Review of Systems  Constitution: Negative for decreased appetite, fever and weight  gain.  HENT: Negative for congestion, ear discharge, hoarse voice and sore throat.   Eyes: Negative for discharge, redness, vision loss in right eye and visual halos.  Cardiovascular: Negative for chest pain, dyspnea on exertion, leg swelling, orthopnea and palpitations.  Respiratory: Negative for cough, hemoptysis, shortness of breath and snoring.   Endocrine: Negative for heat intolerance and polyphagia.  Hematologic/Lymphatic: Negative for bleeding problem. Does not bruise/bleed easily.  Skin: Negative for flushing, nail changes, rash and suspicious lesions.  Musculoskeletal: Negative for arthritis, joint pain, muscle cramps, myalgias, neck pain and stiffness.  Gastrointestinal: Negative for abdominal pain, bowel incontinence, diarrhea and excessive appetite.  Genitourinary: Negative for decreased libido, genital sores and incomplete emptying.  Neurological: Negative for brief paralysis, focal weakness, headaches and loss of balance.  Psychiatric/Behavioral: Negative for altered mental status, depression and suicidal ideas.  Allergic/Immunologic: Negative for HIV exposure and persistent infections.    EKGs/Labs/Other Studies Reviewed:    The following studies were reviewed today:   EKG:  The ekg ordered today demonstrates   Recent Labs: No results found for requested labs within last 365 days.  Recent Lipid Panel    Component Value Date/Time   CHOL 161 03/11/2024 1027   TRIG 74 03/11/2024 1027   HDL 57 03/11/2024 1027   CHOLHDL 2.8 03/11/2024 1027   LDLCALC 90 03/11/2024 1027    Physical Exam:    VS:  BP (!) 164/110 (BP Location: Left Arm, Patient Position: Sitting, Cuff Size: Normal)   Pulse 74   Ht 5' 4 (1.626 m)   Wt 197 lb 14.4 oz (89.8 kg)   SpO2 97%   BMI 33.97 kg/m     Wt Readings from Last 3 Encounters:  10/02/24 197 lb 14.4 oz (89.8 kg)  10/01/24 196 lb (88.9 kg)  03/11/24 200 lb (90.7 kg)     GEN: Well nourished, well developed in no acute  distress HEENT: Normal NECK: No JVD; No carotid bruits LYMPHATICS: No lymphadenopathy CARDIAC: S1S2 noted,RRR, no murmurs, rubs, gallops RESPIRATORY:  Clear to auscultation without rales, wheezing or rhonchi  ABDOMEN: Soft, non-tender, non-distended, +bowel sounds, no guarding. EXTREMITIES: No edema, No cyanosis, no clubbing MUSCULOSKELETAL:  No deformity  SKIN: Warm and dry NEUROLOGIC:  Alert and oriented x 3, non-focal PSYCHIATRIC:  Normal affect, good insight  ASSESSMENT:    1. Hypertension, unspecified type   2. Primary hypertension   3. PVC's (premature ventricular contractions)   4. Prediabetes    PLAN:    Hypertension -  Hypertension remains uncontrolled with home readings 140/80 to 150/80 mmHg. Current regimen includes valsartan  and metoprolol , which have not achieved desired control. Goal is <130/80 mmHg. - Switch  metoprolol  to Coreg - Continue valsartan . - Consider adding a diuretic if blood pressure remains uncontrolled. - Consider switching valsartan  to olmesartan if current regimen is ineffective. - Schedule follow-up with pharmacist, PA, or nurse practitioner in 2 weeks to assess medication efficacy. She is hoping to be only on two antiphypertensives, but I shared with her that sometimes it may require 3 or more to bring and keep to the target of <130/62mmhg.  PVC Palpitations managed with metoprolol , reduced symptoms but described as an annoyance. - Switch metoprolol  to Coreg (carvedilol) - will monitor for symptoms.  Prediabetes Previous blood work indicated slightly elevated hemoglobin A1c suggestive of prediabetes. - Implement diet modification focusing on reducing carbohydrate intake.  The patient is in agreement with the above plan. The patient left the office in stable condition.  The patient will follow up in   Medication Adjustments/Labs and Tests Ordered: Current medicines are reviewed at length with the patient today.  Concerns regarding medicines are  outlined above.  Orders Placed This Encounter  Procedures   AMB Referral to [facility] Pharm-D   Meds ordered this encounter  Medications   carvedilol (COREG) 6.25 MG tablet    Sig: Take 1 tablet (6.25 mg total) by mouth 2 (two) times daily.    Dispense:  60 tablet    Refill:  6    Patient Instructions  Medication Instructions:   Stop Toprol    Start Carvedilol 6.25 mg  *If you need a refill on your cardiac medications before your next appointment, please call your pharmacy*   Lab Work: Not needed If you have labs (blood work) drawn today and your tests are completely normal, you will receive your results only by: MyChart Message (if you have MyChart) OR A paper copy in the mail If you have any lab test that is abnormal or we need to change your treatment, we will call you to review the results.   Testing/Procedures:  Not  needed  Follow-Up: At [facility], you and your health needs are our priority.  As part of our continuing mission to provide you with exceptional heart care, we have created designated Provider Care Teams.  These Care Teams include your primary Cardiologist (physician) and Advanced Practice Providers (APPs -  Physician Assistants and Nurse Practitioners) who all work together to provide you with the care you need, when you need it.     Your next appointment:   16 week(s)  The format for your next appointment:   In Person  Provider:   Mychaela Lennartz,MD  You have been referred to  [facility]  at 2 weeks  Other Instructions    Adopting a Healthy Lifestyle.  Know what a healthy weight is for you (roughly BMI <25) and aim to maintain this   Aim for 7+ servings of fruits and vegetables daily   65-80+ fluid ounces of water or unsweet tea for healthy kidneys   Limit to max 1 drink of alcohol per day; avoid smoking/tobacco   Limit animal fats in diet for cholesterol and heart health - choose grass fed whenever available   Avoid highly processed foods,  and foods high in saturated/trans fats   Aim for low stress - take time to unwind and care for your mental health   Aim for 150 min of moderate intensity exercise weekly for heart health, and weights twice weekly for bone health   Aim for 7-9 hours of sleep daily   When it comes to diets, agreement about the perfect plan  isnt easy to find, even among the experts. Experts at the [facility] [employer] developed an idea known as the Healthy Eating Plate. Just imagine a plate divided into logical, healthy portions.   The emphasis is on diet quality:   Load up on vegetables and fruits - one-half of your plate: Aim for color and variety, and remember that potatoes dont count.   Go for whole grains - one-quarter of your plate: Whole wheat, barley, wheat berries, quinoa, oats, brown rice, and foods made with them. If you want pasta, go with whole wheat pasta.   Protein power - one-quarter of your plate: Fish, chicken, beans, and nuts are all healthy, versatile protein sources. Limit red meat.   The diet, however, does go beyond the plate, offering a few other suggestions.   Use healthy plant oils, such as olive, canola, soy, corn, sunflower and peanut. Check the labels, and avoid partially hydrogenated oil, which have unhealthy trans fats.   If youre thirsty, drink water. Coffee and tea are good in moderation, but skip sugary drinks and limit milk and dairy products to one or two daily servings.   The type of carbohydrate in the diet is more important than the amount. Some sources of carbohydrates, such as vegetables, fruits, whole grains, and beans-are healthier than others.   Finally, stay active  Signed, Dub Huntsman, DO  10/02/2024 10:08 AM    [facility]

## 2024-10-15 ENCOUNTER — Ambulatory Visit: Attending: Internal Medicine | Admitting: Pharmacist

## 2024-10-15 ENCOUNTER — Encounter: Payer: Self-pay | Admitting: Pharmacist

## 2024-10-15 VITALS — BP 155/109 | HR 78

## 2024-10-15 DIAGNOSIS — I10 Essential (primary) hypertension: Secondary | ICD-10-CM

## 2024-10-15 MED ORDER — VALSARTAN-HYDROCHLOROTHIAZIDE 320-12.5 MG PO TABS: 1.0000 | ORAL_TABLET | Freq: Every day | ORAL | 5 refills | Status: DC

## 2024-10-15 NOTE — Progress Notes (Signed)
 Patient ID: Tammy Barrera                 DOB: Oct 25, 1961                      MRN: [id number]     HPI: Tammy Barrera is a 63 y.o. female referred by Dr. Sheena to HTN clinic. PMH is significant for HLD and HTN.  Patient seen by Dr Sheena on 10/17 and was hypertensive in office. Metoprolol  was switched to carvedilol and patient was asked to track BP readings before follow up.  Patient presents today to discuss BP management. Has not noticed a blood pressure decrease since switching from metoprolol  to carvedilol.  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 After work goes to her sister's house to care for her 51 year old father with health issues.   She has reduced caffiene intake. Was previously drinking coffee all day. Now only 2 cups.  Has downloaded the weight watchers app on her phone to help with healthy eating and exercise. Is limited by time due to work and then caregiver obligations. Eats mostly vegetables with occasional crackers for snacks.  Denies dizziness, lightheadedness, or chest pain.  Reports a history of white coat HTN.  Current HTN meds:  Carvedilol 6.25mg  BID Valsartan  160mg  daily  BP goal: <130/80    Wt Readings from Last 3 Encounters:  10/02/24 197 lb 14.4 oz (89.8 kg)  10/01/24 196 lb (88.9 kg)  03/11/24 200 lb (90.7 kg)   BP Readings from Last 3 Encounters:  10/02/24 (!) 164/110  10/01/24 138/84  03/11/24 (!) 166/110   Pulse Readings from Last 3 Encounters:  10/02/24 74  10/01/24 93  03/11/24 85    Renal function: CrCl cannot be calculated (Patient's most recent lab result is older than the maximum 21 days allowed.).  Past Medical History:  Diagnosis Date   Caregiver stress    Family history of diabetes mellitus (DM)    GERD (gastroesophageal reflux disease)    Hyperlipidemia    Hypertension    Migraines    Obesity    Vitamin D   deficiency     Current Outpatient Medications on File Prior to Visit  Medication Sig Dispense Refill   carvedilol (COREG) 6.25 MG tablet Take 1 tablet (6.25 mg total) by mouth 2 (two) times daily. 60 tablet 6   MAGNESIUM-ZINC PO Take 1 tablet by mouth daily.     Multiple Vitamin (MULTIVITAMIN PO) Take 1 tablet by mouth daily.     rosuvastatin  (CRESTOR ) 10 MG tablet Take 1 tablet (10 mg total) by mouth daily. 90 tablet 1   Thiamine HCl (THIAMINE PO) Take 1 tablet by mouth daily.     valsartan  (DIOVAN ) 160 MG tablet Take 1 tablet (160 mg total) by mouth 2 (two) times daily. 180 tablet 3   VITAMIN D  PO Take 1 tablet by mouth daily.     No current facility-administered medications on file prior to visit.    No Known Allergies   Assessment/Plan:  1. Hypertension -  Patient BP in room 155/109 which is above goal of <130/80. Discussed options with patient who was hesitant to add more medications. Advised valsartan  was at max dose but carvedilol was not. Due to care giver obligations, will keep at current dose to avoid fatigue. To reduce pill burden, recommend starting valsartan /hydrochlorothiazide once daily and check BMP in 1-2 weeks.  Patient to send home BP readings via myChart.  Continue carvedilol 6.25mg  BID D/c valsartan  160mg  BID Start valsartan /hydrochlorothiazide 320/12.5mg  daily Check BMP in 1-2 weeks F/u via rhona Medford Bolk, PharmD, BCACP, CDCES, CPP [facility] [address], [place], [place] [address] Phone: [phone]; Fax: [phone] 10/15/2024 1:56 PM

## 2024-10-15 NOTE — Patient Instructions (Addendum)
 It was nice meeting you today  We would like your blood pressure to be less than 130/80  Please continue your carvedilol 6.25mg  twice a day  We will switch your valsartan  160mg  twice a day to valsartan /hydrochlorothiazide 320/12.5mg  once daily in the morning  Please update your lab work in about 1-2 weeks after starting  Try to increase physical activity  Please update us  with your home readings via mychart  Medford Bolk, PharmD, BCACP, CDCES, CPP [facility] [address], [place], [place] [address] Phone: [phone]  Fax: [phone] 10/15/2024 8:08 AM

## 2024-10-27 ENCOUNTER — Encounter: Payer: Self-pay | Admitting: Pharmacist

## 2024-10-31 LAB — BASIC METABOLIC PANEL WITH GFR
BUN/Creatinine Ratio: 13 (ref 12–28)
BUN: 12 mg/dL (ref 8–27)
CO2: 29 mmol/L (ref 20–29)
Calcium: 9.4 mg/dL (ref 8.7–10.3)
Chloride: 103 mmol/L (ref 96–106)
Creatinine, Ser: 0.93 mg/dL (ref 0.57–1.00)
Glucose: 86 mg/dL (ref 70–99)
Potassium: 4.3 mmol/L (ref 3.5–5.2)
Sodium: 144 mmol/L (ref 134–144)
eGFR: 69 mL/min/1.73 (ref >59)

## 2024-11-02 ENCOUNTER — Ambulatory Visit: Payer: Self-pay | Admitting: Pharmacist

## 2024-11-03 ENCOUNTER — Other Ambulatory Visit: Payer: Self-pay

## 2024-11-03 DIAGNOSIS — I10 Essential (primary) hypertension: Secondary | ICD-10-CM

## 2024-11-03 MED ORDER — VALSARTAN-HYDROCHLOROTHIAZIDE 320-12.5 MG PO TABS: 1.0000 | ORAL_TABLET | Freq: Every day | ORAL | 3 refills | Status: AC

## 2024-11-03 NOTE — Telephone Encounter (Signed)
 Prescription sent to pharmacy.

## 2025-01-21 ENCOUNTER — Ambulatory Visit: Admitting: Cardiology

## 2025-04-16 ENCOUNTER — Ambulatory Visit: Admitting: Cardiology
